# Patient Record
Sex: Female | Born: 1995 | Race: White | Hispanic: No | Marital: Married | Smoking: Never smoker
Health system: Southern US, Community
[De-identification: ages and names within clinical notes are randomized; demographics above are authoritative.]

## PROBLEM LIST (undated history)

## (undated) DIAGNOSIS — Z9889 Other specified postprocedural states: Secondary | ICD-10-CM

## (undated) DIAGNOSIS — Z87442 Personal history of urinary calculi: Secondary | ICD-10-CM

## (undated) DIAGNOSIS — E039 Hypothyroidism, unspecified: Secondary | ICD-10-CM

## (undated) DIAGNOSIS — R112 Nausea with vomiting, unspecified: Secondary | ICD-10-CM

## (undated) HISTORY — PX: CHOLECYSTECTOMY: SHX55

---

## 2016-07-30 ENCOUNTER — Other Ambulatory Visit: Payer: Self-pay | Admitting: Medical

## 2016-07-30 DIAGNOSIS — N921 Excessive and frequent menstruation with irregular cycle: Secondary | ICD-10-CM

## 2016-08-08 ENCOUNTER — Other Ambulatory Visit: Payer: Self-pay

## 2016-08-12 ENCOUNTER — Ambulatory Visit
Admission: RE | Admit: 2016-08-12 | Discharge: 2016-08-12 | Disposition: A | Discharge status: Home / Self Care | Payer: BC Managed Care – PPO | Source: Ambulatory Visit | Attending: Medical | Admitting: Medical

## 2016-08-12 DIAGNOSIS — N921 Excessive and frequent menstruation with irregular cycle: Secondary | ICD-10-CM

## 2017-01-10 ENCOUNTER — Other Ambulatory Visit: Payer: Self-pay | Admitting: Internal Medicine

## 2017-01-10 DIAGNOSIS — R1012 Left upper quadrant pain: Secondary | ICD-10-CM

## 2017-04-06 ENCOUNTER — Other Ambulatory Visit: Payer: Self-pay | Admitting: Internal Medicine

## 2017-04-06 DIAGNOSIS — R58 Hemorrhage, not elsewhere classified: Secondary | ICD-10-CM

## 2017-04-06 DIAGNOSIS — O209 Hemorrhage in early pregnancy, unspecified: Secondary | ICD-10-CM

## 2017-06-01 LAB — OB RESULTS CONSOLE RPR: RPR: NONREACTIVE

## 2017-06-01 LAB — OB RESULTS CONSOLE ABO/RH: RH TYPE: NEGATIVE

## 2017-06-01 LAB — OB RESULTS CONSOLE HIV ANTIBODY (ROUTINE TESTING): HIV: NONREACTIVE

## 2017-06-01 LAB — OB RESULTS CONSOLE RUBELLA ANTIBODY, IGM: RUBELLA: IMMUNE

## 2017-06-01 LAB — OB RESULTS CONSOLE HEPATITIS B SURFACE ANTIGEN: Hepatitis B Surface Ag: NEGATIVE

## 2017-06-01 LAB — OB RESULTS CONSOLE ANTIBODY SCREEN: Antibody Screen: NEGATIVE

## 2017-06-01 LAB — OB RESULTS CONSOLE GC/CHLAMYDIA
Chlamydia: NEGATIVE
Gonorrhea: NEGATIVE

## 2017-12-29 ENCOUNTER — Encounter (HOSPITAL_COMMUNITY): Payer: Self-pay | Admitting: *Deleted

## 2017-12-29 LAB — OB RESULTS CONSOLE GBS: GBS: NEGATIVE

## 2018-01-04 NOTE — Patient Instructions (Signed)
Robin Walker  01/04/2018   Your procedure is scheduled on:  01/06/2018  Enter through the Main Entrance of [facility] at 0530 AM.  Pick up the phone at the desk and dial [phone]  Call this number if you have problems the morning of [phone]  Remember:   Do not eat food:(After Midnight) Desps de medianoche.  Do not drink clear liquids: (After Midnight) Desps de medianoche.  Take these medicines the morning of surgery with A SIP OF WATER: synthroid   Do not wear jewelry, make-up or nail polish.  Do not wear lotions, powders, or perfumes. Do not wear deodorant.  Do not shave 48 hours prior to surgery.  Do not bring valuables to the hospital.  [facility] is not   responsible for any belongings or valuables brought to the hospital.  Contacts, dentures or bridgework may not be worn into surgery.  Leave suitcase in the car. After surgery it may be brought to your room.  For patients admitted to the hospital, checkout time is 11:00 AM the day of              discharge.    N/A   Please read over the following fact sheets that you were given:   Surgical Site Infection Prevention

## 2018-01-05 ENCOUNTER — Encounter (HOSPITAL_COMMUNITY)
Admission: RE | Admit: 2018-01-05 | Discharge: 2018-01-05 | Disposition: A | Discharge status: Home / Self Care | Payer: BC Managed Care – PPO | Source: Ambulatory Visit | Attending: Obstetrics and Gynecology | Admitting: Obstetrics and Gynecology

## 2018-01-05 HISTORY — DX: Nausea with vomiting, unspecified: R11.2

## 2018-01-05 HISTORY — DX: Other specified postprocedural states: Z98.890

## 2018-01-05 HISTORY — DX: Hypothyroidism, unspecified: E03.9

## 2018-01-05 HISTORY — DX: Personal history of urinary calculi: Z87.442

## 2018-01-05 LAB — CBC
HCT: 35.6 % — ABNORMAL LOW (ref 36.0–46.0)
HEMOGLOBIN: 11.9 g/dL — AB (ref 12.0–15.0)
MCH: 29.7 pg (ref 26.0–34.0)
MCHC: 33.4 g/dL (ref 30.0–36.0)
MCV: 88.8 fL (ref 80.0–100.0)
NRBC: 0 % (ref 0.0–0.2)
PLATELETS: 256 10*3/uL (ref 150–400)
RBC: 4.01 MIL/uL (ref 3.87–5.11)
RDW: 14.1 % (ref 11.5–15.5)
WBC: 11.1 10*3/uL — AB (ref 4.0–10.5)

## 2018-01-05 NOTE — Anesthesia Preprocedure Evaluation (Addendum)
Anesthesia Evaluation  Patient identified by MRN, date of birth, ID band Patient awake    Reviewed: Allergy & Precautions, H&P , NPO status , Patient's Chart, lab work & pertinent test results, reviewed documented beta blocker date and time   History of Anesthesia Complications (+) PONV and history of anesthetic complications  Airway Mallampati: II  TM Distance: >3 FB Neck ROM: full    Dental no notable dental hx.    Pulmonary neg pulmonary ROS,    Pulmonary exam normal breath sounds clear to auscultation       Cardiovascular Exercise Tolerance: Good negative cardio ROS   Rhythm:regular Rate:Normal     Neuro/Psych negative neurological ROS  negative psych ROS   GI/Hepatic negative GI ROS, Neg liver ROS,   Endo/Other  negative endocrine ROSHypothyroidism Morbid obesity  Renal/GU negative Renal ROS  negative genitourinary   Musculoskeletal   Abdominal   Peds  Hematology negative hematology ROS (+)   Anesthesia Other Findings   Reproductive/Obstetrics negative OB ROS                           Anesthesia Physical Anesthesia Plan  ASA: III  Anesthesia Plan: General   Post-op Pain Management:    Induction:   PONV Risk Score and Plan: 3 and Treatment may vary due to age or medical condition and Scopolamine patch - Pre-op  Airway Management Planned: Nasal Cannula and Natural Airway  Additional Equipment:   Intra-op Plan:   Post-operative Plan:   Informed Consent: I have reviewed the patients History and Physical, chart, labs and discussed the procedure including the risks, benefits and alternatives for the proposed anesthesia with the patient or authorized representative who has indicated his/her understanding and acceptance.   Dental Advisory Given  Plan Discussed with: CRNA, Anesthesiologist and Surgeon  Anesthesia Plan Comments: (  )      Anesthesia Quick  Evaluation

## 2018-01-06 ENCOUNTER — Inpatient Hospital Stay (HOSPITAL_COMMUNITY)
Admission: AD | Admit: 2018-01-06 | Discharge: 2018-01-09 | DRG: 788 | Disposition: A | Discharge status: Home / Self Care | Payer: BC Managed Care – PPO | Attending: Obstetrics and Gynecology | Admitting: Obstetrics and Gynecology

## 2018-01-06 ENCOUNTER — Encounter (HOSPITAL_COMMUNITY): Payer: Self-pay | Admitting: *Deleted

## 2018-01-06 ENCOUNTER — Inpatient Hospital Stay (HOSPITAL_COMMUNITY): Payer: BC Managed Care – PPO | Admitting: Anesthesiology

## 2018-01-06 ENCOUNTER — Encounter (HOSPITAL_COMMUNITY)
Admission: AD | Disposition: A | Discharge status: Home / Self Care | Payer: Self-pay | Source: Home / Self Care | Attending: Obstetrics and Gynecology

## 2018-01-06 ENCOUNTER — Other Ambulatory Visit: Payer: Self-pay

## 2018-01-06 DIAGNOSIS — E039 Hypothyroidism, unspecified: Secondary | ICD-10-CM | POA: Diagnosis present

## 2018-01-06 DIAGNOSIS — Z6791 Unspecified blood type, Rh negative: Secondary | ICD-10-CM | POA: Diagnosis not present

## 2018-01-06 DIAGNOSIS — Z3A39 39 weeks gestation of pregnancy: Secondary | ICD-10-CM | POA: Diagnosis not present

## 2018-01-06 DIAGNOSIS — O99284 Endocrine, nutritional and metabolic diseases complicating childbirth: Secondary | ICD-10-CM | POA: Diagnosis present

## 2018-01-06 DIAGNOSIS — O99214 Obesity complicating childbirth: Secondary | ICD-10-CM | POA: Diagnosis present

## 2018-01-06 DIAGNOSIS — O26893 Other specified pregnancy related conditions, third trimester: Secondary | ICD-10-CM | POA: Diagnosis present

## 2018-01-06 DIAGNOSIS — O3663X0 Maternal care for excessive fetal growth, third trimester, not applicable or unspecified: Secondary | ICD-10-CM | POA: Diagnosis present

## 2018-01-06 LAB — RPR: RPR Ser Ql: NONREACTIVE

## 2018-01-06 SURGERY — Surgical Case
Anesthesia: General | Site: Abdomen | Wound class: Clean Contaminated

## 2018-01-06 MED ORDER — BUPIVACAINE IN DEXTROSE 0.75-8.25 % IT SOLN
INTRATHECAL | Status: DC | PRN
  Administered 2018-01-06: 1.6 mL via INTRATHECAL

## 2018-01-06 MED ORDER — SCOPOLAMINE 1 MG/3DAYS TD PT72
MEDICATED_PATCH | TRANSDERMAL | Status: AC
  Filled 2018-01-06: qty 1

## 2018-01-06 MED ORDER — LACTATED RINGERS IV SOLN
INTRAVENOUS | Status: DC | PRN
  Administered 2018-01-06 (×2): via INTRAVENOUS

## 2018-01-06 MED ORDER — MORPHINE SULFATE (PF) 0.5 MG/ML IJ SOLN
INTRAMUSCULAR | Status: AC
  Filled 2018-01-06: qty 10

## 2018-01-06 MED ORDER — OXYTOCIN 10 UNIT/ML IJ SOLN
INTRAMUSCULAR | Status: AC
  Filled 2018-01-06: qty 4

## 2018-01-06 MED ORDER — OXYCODONE HCL 5 MG/5ML PO SOLN: 5.0000 mg | Freq: Once | ORAL | Status: DC | PRN

## 2018-01-06 MED ORDER — SIMETHICONE 80 MG PO CHEW
80.0000 mg | CHEWABLE_TABLET | ORAL | Status: DC
  Administered 2018-01-07 – 2018-01-08 (×3): 80 mg via ORAL
  Filled 2018-01-06 (×4): qty 1

## 2018-01-06 MED ORDER — MORPHINE SULFATE (PF) 0.5 MG/ML IJ SOLN
INTRAMUSCULAR | Status: DC | PRN
  Administered 2018-01-06: .15 mg via INTRATHECAL

## 2018-01-06 MED ORDER — FENTANYL CITRATE (PF) 100 MCG/2ML IJ SOLN
INTRAMUSCULAR | Status: AC
  Filled 2018-01-06: qty 2

## 2018-01-06 MED ORDER — OXYTOCIN 40 UNITS IN LACTATED RINGERS INFUSION - SIMPLE MED: 2.5000 [IU]/h | INTRAVENOUS | Status: AC

## 2018-01-06 MED ORDER — TETANUS-DIPHTH-ACELL PERTUSSIS 5-2.5-18.5 LF-MCG/0.5 IM SUSP: 0.5000 mL | Freq: Once | INTRAMUSCULAR | Status: DC

## 2018-01-06 MED ORDER — SENNOSIDES-DOCUSATE SODIUM 8.6-50 MG PO TABS
2.0000 | ORAL_TABLET | ORAL | Status: DC
  Administered 2018-01-07 – 2018-01-08 (×3): 2 via ORAL
  Filled 2018-01-06 (×5): qty 2

## 2018-01-06 MED ORDER — PHENYLEPHRINE 8 MG IN D5W 100 ML (0.08MG/ML) PREMIX OPTIME
INJECTION | INTRAVENOUS | Status: AC
  Filled 2018-01-06: qty 100

## 2018-01-06 MED ORDER — SCOPOLAMINE 1 MG/3DAYS TD PT72
1.0000 | MEDICATED_PATCH | Freq: Once | TRANSDERMAL | Status: AC
  Administered 2018-01-06: 1.5 mg via TRANSDERMAL

## 2018-01-06 MED ORDER — MEASLES, MUMPS & RUBELLA VAC IJ SOLR
0.5000 mL | Freq: Once | INTRAMUSCULAR | Status: DC
  Filled 2018-01-06: qty 0.5

## 2018-01-06 MED ORDER — NALOXONE HCL 4 MG/10ML IJ SOLN: 1.0000 ug/kg/h | INTRAVENOUS | Status: DC | PRN

## 2018-01-06 MED ORDER — DIPHENHYDRAMINE HCL 25 MG PO CAPS: 25.0000 mg | ORAL_CAPSULE | ORAL | Status: DC | PRN

## 2018-01-06 MED ORDER — DEXAMETHASONE SODIUM PHOSPHATE 4 MG/ML IJ SOLN
INTRAMUSCULAR | Status: DC | PRN
  Administered 2018-01-06: 4 mg via INTRAVENOUS

## 2018-01-06 MED ORDER — KETOROLAC TROMETHAMINE 30 MG/ML IJ SOLN
INTRAMUSCULAR | Status: AC
  Filled 2018-01-06: qty 1

## 2018-01-06 MED ORDER — LACTATED RINGERS IV SOLN
INTRAVENOUS | Status: DC | PRN
  Administered 2018-01-06: 08:00:00 via INTRAVENOUS

## 2018-01-06 MED ORDER — SIMETHICONE 80 MG PO CHEW
80.0000 mg | CHEWABLE_TABLET | Freq: Three times a day (TID) | ORAL | Status: DC
  Administered 2018-01-08 (×2): 80 mg via ORAL
  Filled 2018-01-06 (×5): qty 1

## 2018-01-06 MED ORDER — DEXAMETHASONE SODIUM PHOSPHATE 4 MG/ML IJ SOLN
INTRAMUSCULAR | Status: AC
  Filled 2018-01-06: qty 1

## 2018-01-06 MED ORDER — KETOROLAC TROMETHAMINE 30 MG/ML IJ SOLN
30.0000 mg | Freq: Four times a day (QID) | INTRAMUSCULAR | Status: AC | PRN
  Administered 2018-01-06: 30 mg via INTRAMUSCULAR

## 2018-01-06 MED ORDER — PHENYLEPHRINE 8 MG IN D5W 100 ML (0.08MG/ML) PREMIX OPTIME
INJECTION | INTRAVENOUS | Status: DC | PRN
  Administered 2018-01-06: 60 ug/min via INTRAVENOUS

## 2018-01-06 MED ORDER — SODIUM CHLORIDE 0.9 % IV SOLN
2.0000 g | INTRAVENOUS | Status: AC
  Administered 2018-01-06: 2 g via INTRAVENOUS
  Filled 2018-01-06: qty 2

## 2018-01-06 MED ORDER — KETOROLAC TROMETHAMINE 30 MG/ML IJ SOLN: 30.0000 mg | Freq: Four times a day (QID) | INTRAMUSCULAR | Status: AC | PRN

## 2018-01-06 MED ORDER — IBUPROFEN 600 MG PO TABS
600.0000 mg | ORAL_TABLET | Freq: Four times a day (QID) | ORAL | Status: DC
  Administered 2018-01-06 – 2018-01-09 (×11): 600 mg via ORAL
  Filled 2018-01-06 (×11): qty 1

## 2018-01-06 MED ORDER — SIMETHICONE 80 MG PO CHEW
80.0000 mg | CHEWABLE_TABLET | ORAL | Status: DC | PRN
  Administered 2018-01-06 – 2018-01-07 (×2): 80 mg via ORAL

## 2018-01-06 MED ORDER — NALBUPHINE HCL 10 MG/ML IJ SOLN: 5.0000 mg | INTRAMUSCULAR | Status: DC | PRN

## 2018-01-06 MED ORDER — COCONUT OIL OIL
1.0000 "application " | TOPICAL_OIL | Status: DC | PRN
  Administered 2018-01-08: 1 via TOPICAL
  Filled 2018-01-06: qty 120

## 2018-01-06 MED ORDER — FENTANYL CITRATE (PF) 100 MCG/2ML IJ SOLN
INTRAMUSCULAR | Status: DC | PRN
  Administered 2018-01-06: 15 ug via INTRATHECAL

## 2018-01-06 MED ORDER — ACETAMINOPHEN 325 MG PO TABS
650.0000 mg | ORAL_TABLET | ORAL | Status: DC | PRN
  Administered 2018-01-08 (×2): 650 mg via ORAL
  Filled 2018-01-06 (×2): qty 2

## 2018-01-06 MED ORDER — MEPERIDINE HCL 25 MG/ML IJ SOLN: 6.2500 mg | INTRAMUSCULAR | Status: DC | PRN

## 2018-01-06 MED ORDER — LACTATED RINGERS IV SOLN
INTRAVENOUS | Status: DC
  Administered 2018-01-06 – 2018-01-07 (×2): via INTRAVENOUS

## 2018-01-06 MED ORDER — NALBUPHINE HCL 10 MG/ML IJ SOLN: 5.0000 mg | Freq: Once | INTRAMUSCULAR | Status: DC | PRN

## 2018-01-06 MED ORDER — ACETAMINOPHEN 325 MG PO TABS: 325.0000 mg | ORAL_TABLET | ORAL | Status: DC | PRN

## 2018-01-06 MED ORDER — WITCH HAZEL-GLYCERIN EX PADS: 1.0000 "application " | MEDICATED_PAD | CUTANEOUS | Status: DC | PRN

## 2018-01-06 MED ORDER — LEVOTHYROXINE SODIUM 125 MCG PO TABS
125.0000 ug | ORAL_TABLET | Freq: Every day | ORAL | Status: DC
  Administered 2018-01-07 – 2018-01-09 (×3): 125 ug via ORAL
  Filled 2018-01-06 (×4): qty 1

## 2018-01-06 MED ORDER — DIBUCAINE 1 % RE OINT: 1.0000 "application " | TOPICAL_OINTMENT | RECTAL | Status: DC | PRN

## 2018-01-06 MED ORDER — OXYCODONE HCL 5 MG PO TABS: 5.0000 mg | ORAL_TABLET | Freq: Once | ORAL | Status: DC | PRN

## 2018-01-06 MED ORDER — LACTATED RINGERS IV SOLN
INTRAVENOUS | Status: DC
  Administered 2018-01-06: 08:00:00 via INTRAVENOUS

## 2018-01-06 MED ORDER — ONDANSETRON HCL 4 MG/2ML IJ SOLN
INTRAMUSCULAR | Status: AC
  Filled 2018-01-06: qty 2

## 2018-01-06 MED ORDER — ONDANSETRON HCL 4 MG/2ML IJ SOLN: 4.0000 mg | Freq: Once | INTRAMUSCULAR | Status: DC | PRN

## 2018-01-06 MED ORDER — OXYTOCIN 10 UNIT/ML IJ SOLN
INTRAVENOUS | Status: DC | PRN
  Administered 2018-01-06: 40 [IU] via INTRAVENOUS

## 2018-01-06 MED ORDER — NALOXONE HCL 0.4 MG/ML IJ SOLN: 0.4000 mg | INTRAMUSCULAR | Status: DC | PRN

## 2018-01-06 MED ORDER — DIPHENHYDRAMINE HCL 50 MG/ML IJ SOLN: 12.5000 mg | INTRAMUSCULAR | Status: DC | PRN

## 2018-01-06 MED ORDER — ZOLPIDEM TARTRATE 5 MG PO TABS: 5.0000 mg | ORAL_TABLET | Freq: Every evening | ORAL | Status: DC | PRN

## 2018-01-06 MED ORDER — ONDANSETRON HCL 4 MG/2ML IJ SOLN: 4.0000 mg | Freq: Three times a day (TID) | INTRAMUSCULAR | Status: DC | PRN

## 2018-01-06 MED ORDER — ACETAMINOPHEN 160 MG/5ML PO SOLN: 325.0000 mg | ORAL | Status: DC | PRN

## 2018-01-06 MED ORDER — SODIUM CHLORIDE 0.9% FLUSH: 3.0000 mL | INTRAVENOUS | Status: DC | PRN

## 2018-01-06 MED ORDER — OXYCODONE HCL 5 MG PO TABS: 10.0000 mg | ORAL_TABLET | ORAL | Status: DC | PRN

## 2018-01-06 MED ORDER — FENTANYL CITRATE (PF) 100 MCG/2ML IJ SOLN: 25.0000 ug | INTRAMUSCULAR | Status: DC | PRN

## 2018-01-06 MED ORDER — ONDANSETRON HCL 4 MG/2ML IJ SOLN
INTRAMUSCULAR | Status: DC | PRN
  Administered 2018-01-06: 4 mg via INTRAVENOUS

## 2018-01-06 MED ORDER — MENTHOL 3 MG MT LOZG: 1.0000 | LOZENGE | OROMUCOSAL | Status: DC | PRN

## 2018-01-06 MED ORDER — OXYCODONE HCL 5 MG PO TABS: 5.0000 mg | ORAL_TABLET | ORAL | Status: DC | PRN

## 2018-01-06 MED ORDER — SODIUM CHLORIDE 0.9 % IR SOLN
Status: DC | PRN
  Administered 2018-01-06: 1

## 2018-01-06 MED ORDER — PRENATAL MULTIVITAMIN CH
1.0000 | ORAL_TABLET | Freq: Every day | ORAL | Status: DC
  Administered 2018-01-07 – 2018-01-08 (×2): 1 via ORAL
  Filled 2018-01-06: qty 1

## 2018-01-06 MED ORDER — DIPHENHYDRAMINE HCL 25 MG PO CAPS: 25.0000 mg | ORAL_CAPSULE | Freq: Four times a day (QID) | ORAL | Status: DC | PRN

## 2018-01-06 SURGICAL SUPPLY — 34 items
CHLORAPREP W/TINT 26ML (MISCELLANEOUS) ×2 IMPLANT
CLAMP CORD UMBIL (MISCELLANEOUS) IMPLANT
CLOTH BEACON ORANGE TIMEOUT ST (SAFETY) ×2 IMPLANT
DRSG OPSITE POSTOP 4X10 (GAUZE/BANDAGES/DRESSINGS) ×2 IMPLANT
ELECT REM PT RETURN 9FT ADLT (ELECTROSURGICAL) ×2
ELECTRODE REM PT RTRN 9FT ADLT (ELECTROSURGICAL) ×1 IMPLANT
EXTRACTOR VACUUM M CUP 4 TUBE (SUCTIONS) IMPLANT
GLOVE BIOGEL PI IND STRL 7.0 (GLOVE) ×1 IMPLANT
GLOVE BIOGEL PI INDICATOR 7.0 (GLOVE) ×1
GLOVE SURG ORTHO 8.0 STRL STRW (GLOVE) ×2 IMPLANT
GOWN STRL REUS W/TWL LRG LVL3 (GOWN DISPOSABLE) ×4 IMPLANT
HOVERMATT SINGLE USE (MISCELLANEOUS) ×2 IMPLANT
KIT ABG SYR 3ML LUER SLIP (SYRINGE) ×2 IMPLANT
NEEDLE HYPO 25X5/8 SAFETYGLIDE (NEEDLE) ×2 IMPLANT
NS IRRIG 1000ML POUR BTL (IV SOLUTION) ×2 IMPLANT
PACK C SECTION WH (CUSTOM PROCEDURE TRAY) ×2 IMPLANT
PAD OB MATERNITY 4.3X12.25 (PERSONAL CARE ITEMS) ×2 IMPLANT
PENCIL SMOKE EVAC W/HOLSTER (ELECTROSURGICAL) ×2 IMPLANT
RETRACTOR TRAXI PANNICULUS (MISCELLANEOUS) ×1 IMPLANT
RETRACTOR WND ALEXIS 25 LRG (MISCELLANEOUS) ×1 IMPLANT
RTRCTR WOUND ALEXIS 25CM LRG (MISCELLANEOUS) ×2
SPONGE LAP 18X18 RF (DISPOSABLE) ×6 IMPLANT
SPONGE LAP 18X18 X RAY DECT (DISPOSABLE) ×2 IMPLANT
SUT MNCRL 0 VIOLET CTX 36 (SUTURE) ×4 IMPLANT
SUT MON AB 4-0 PS1 27 (SUTURE) ×2 IMPLANT
SUT MONOCRYL 0 CTX 36 (SUTURE) ×4
SUT PDS AB 1 CT  36 (SUTURE)
SUT PDS AB 1 CT 36 (SUTURE) IMPLANT
SUT PLAIN 2 0 XLH (SUTURE) ×2 IMPLANT
SUT VIC AB 1 CTX 36 (SUTURE)
SUT VIC AB 1 CTX36XBRD ANBCTRL (SUTURE) IMPLANT
TOWEL OR 17X24 6PK STRL BLUE (TOWEL DISPOSABLE) ×2 IMPLANT
TRAXI PANNICULUS RETRACTOR (MISCELLANEOUS) ×1
TRAY FOLEY W/BAG SLVR 14FR LF (SET/KITS/TRAYS/PACK) ×2 IMPLANT

## 2018-01-06 NOTE — Addendum Note (Signed)
Addendum  created 01/06/18 1307 by Cleda ClarksBrowder, Zoe Creasman R, CRNA   Sign clinical note

## 2018-01-06 NOTE — Anesthesia Postprocedure Evaluation (Signed)
Anesthesia Post Note  Patient: Robin Walker  Procedure(s) Performed: CESAREAN SECTION (N/A Abdomen)     Patient location during evaluation: PACU Anesthesia Type: General Level of consciousness: awake and alert Pain management: pain level controlled Vital Signs Assessment: post-procedure vital signs reviewed and stable Respiratory status: spontaneous breathing, nonlabored ventilation, respiratory function stable and patient connected to nasal cannula oxygen Cardiovascular status: blood pressure returned to baseline and stable Postop Assessment: no apparent nausea or vomiting Anesthetic complications: no    Last Vitals:  Vitals:   01/06/18 1000 01/06/18 1015  BP: 136/67 130/65  Pulse: 63 66  Resp: 20 18  Temp:  37 C  SpO2: 99% 99%    Last Pain:  Vitals:   01/06/18 1015  TempSrc: Oral   Pain Goal:                 Edit Ricciardelli

## 2018-01-06 NOTE — Anesthesia Postprocedure Evaluation (Signed)
Anesthesia Post Note  Patient: Set designerAlexandria Walker  Procedure(s) Performed: CESAREAN SECTION (N/A Abdomen)     Patient location during evaluation: Mother Baby Anesthesia Type: Spinal Level of consciousness: awake, awake and alert and oriented Pain management: pain level controlled Vital Signs Assessment: post-procedure vital signs reviewed and stable Respiratory status: spontaneous breathing Cardiovascular status: blood pressure returned to baseline Postop Assessment: no headache, patient able to bend at knees, adequate PO intake, no backache, spinal receding, no apparent nausea or vomiting and able to ambulate Anesthetic complications: no    Last Vitals:  Vitals:   01/06/18 1119 01/06/18 1217  BP: 128/76 99/86  Pulse: 67 81  Resp: 17 17  Temp: 37.2 C   SpO2: 97% 98%    Last Pain:  Vitals:   01/06/18 1119  TempSrc: Oral   Pain Goal:                 Cleda ClarksBrowder, Kayron Hicklin R

## 2018-01-06 NOTE — Op Note (Signed)
Cesarean Section Procedure Note  Pre-operative Diagnosis: IUP at 39 weeks, Macrosomia, borderline pelvis, Pt request C/S  Post-operative Diagnosis: same  Surgeon: Turner DanielsLOWE,Tannya Gonet C   Assistants: none  Anesthesia: Spinal  Procedure:  Low Segment Transverse cesarean section  Procedure Details  The patient was seen in the Holding Room. The risks, benefits, complications, treatment options, and expected outcomes were discussed with the patient.  The patient concurred with the proposed plan, giving informed consent.  The site of surgery properly noted/marked.. A Time Out was held and the above information confirmed.  After induction of anesthesia, the patient was draped and prepped in the usual sterile manner. A Pfannenstiel incision was made and carried down through the subcutaneous tissue to the fascia. Fascial incision was made and extended transversely. The fascia was separated from the underlying rectus tissue superiorly and inferiorly. The peritoneum was identified and entered. Peritoneal incision was extended longitudinally. The utero-vesical peritoneal reflection was incised transversely and the bladder flap was bluntly freed from the lower uterine segment. A low transverse uterine incision was made. Delivered from vertex presentation was a baby with Apgar scores of 9 at one minute and 9 at five minutes. After the umbilical cord was clamped and cut cord blood was obtained for evaluation. The placenta was removed intact and appeared normal. The uterine outline, tubes and ovaries appeared normal. The uterine incision was closed with running locked sutures of 0 monocryl and imbricated with 0 monocryl. Hemostasis was observed. Lavage was carried out until clear. The peritoneum was then closed with 0 monocryl and rectus muscles plicated in the midline.  After hemostasis was assured, the fascia was then reapproximated with running sutures of 0 PDS. Irrigation was applied and after adequate hemostasis was  assured, the skin was reapproximated with subcutaneous sutures using 4-0 monocryl.  Instrument, sponge, and needle counts were correct prior the abdominal closure and at the conclusion of the case. The patient received 2 grams cefotetan preoperatively.  Findings: Viable female  Estimated Blood Loss:  430cc         Specimens: Placenta was sent to labor and delivery         Complications:  None

## 2018-01-06 NOTE — Transfer of Care (Signed)
Immediate Anesthesia Transfer of Care Note  Patient: Robin Walker  Procedure(s) Performed: CESAREAN SECTION (N/A Abdomen)  Patient Location: PACU  Anesthesia Type:Spinal  Level of Consciousness: awake and alert   Airway & Oxygen Therapy: Patient Spontanous Breathing  Post-op Assessment: Report given to RN and Post -op Vital signs reviewed and stable  Post vital signs: Reviewed  Last Vitals:  Vitals Value Taken Time  BP 111/54 01/06/2018  8:55 AM  Temp    Pulse 80 01/06/2018  8:57 AM  Resp 10 01/06/2018  8:57 AM  SpO2 98 % 01/06/2018  8:57 AM  Vitals shown include unvalidated device data.  Last Pain:  Vitals:   01/06/18 0611  TempSrc: Oral         Complications: No apparent anesthesia complications

## 2018-01-06 NOTE — H&P (Signed)
Robin Walker is a 22 y.o. female presenting for primary c/s.  Pregnancy has been complicated by Macrosomia and small pelvis and patient desires primary c/s.  US 1 week ago had EFW 4100gm and fetal vertex out of the pelvis.  Pt desires c/s.  Hypothyroidism with good control and normal glucose screening.  GBS -.  RH neg. OB History    Gravida  2   Para      Term      Preterm      AB  1   Living        SAB  1   TAB      Ectopic      Multiple      Live Births             Past Medical History:  Diagnosis Date  . History of kidney stones   . Hypothyroidism   . PONV (postoperative nausea and vomiting)    Past Surgical History:  Procedure Laterality Date  . CHOLECYSTECTOMY     Family History: family history includes Diabetes in her father and mother; Heart disease in her father; Hypertension in her father. Social History:  reports that she has never smoked. She has never used smokeless tobacco. She reports that she drank alcohol. She reports that she has current or past drug history.     Maternal Diabetes: No Genetic Screening: Normal Maternal Ultrasounds/Referrals: Normal Fetal Ultrasounds or other Referrals:  None Maternal Substance Abuse:  No Significant Maternal Medications:  Meds include: Other: synthroid Significant Maternal Lab Results:  None Other Comments:  None  ROS History   Blood pressure (!) 129/56, pulse 94, temperature 97.8 F (36.6 C), temperature source Oral, resp. rate 18, height 5\' 3"  (1.6 m), weight 118.4 kg, last menstrual period 04/05/2017. Exam Physical Exam  Cl thick and high Prenatal labs: ABO, Rh: --/--/O NEG (12/03 1012) Antibody: POS (12/03 1012) Rubella: Immune (04/29 0000) RPR: Non Reactive (12/03 1012)  HBsAg: Negative (04/29 0000)  HIV: Non-reactive (04/29 0000)  GBS: Negative (11/26 0000)   Assessment/Plan: IUP at 39 weeks Fetal Macrosomia, unfavorable cx and borderline pelvis.  Patient requests primary  c/s. Risks and benefits of C/S were discussed.  All questions were answered and informed consent was obtained.  Plan to proceed with low segment transverse Cesarean Section.This patient has been seen and examined.   All of her questions were answered.  Labs and vital signs reviewed.  Informed consent has been obtained.  The History and Physical is current.   Turner Danielsavid C Sarye Kath 01/06/2018, 7:32 AM

## 2018-01-06 NOTE — Lactation Note (Addendum)
This note was copied from a baby's chart. Lactation Consultation Note  Patient Name: Robin Walker Today's Date: 01/06/2018 Reason for consult: Initial assessment;Term;Primapara;1st time breastfeeding  P1 mother whose infant is now 4010 hours old.    Mother had no questions/concerns related to breast feeding at this time.  She stated that baby has been latching well at times and, at other times, has remained sleepy.  I reassured her this is typical behavior for a newborn at this age.  Mother's breasts are soft and non tender and nipples are intact.  Mother had no pain with latching.  Encouraged to feed 8-12 times/24 hours or sooner if baby shows feeding cues.  Reviewed feeding cues with family.  Mother has been taught hand expression and has been able to obtain a few drops of colostrum which she spoon fed back to baby.  Suggested mother continue hand expression before/after feedings to help increase milk supply.  Mom made aware of O/P services, breastfeeding support groups, community resources, and our phone # for post-discharge questions.   Mother will return to work after her 12 week leave and has a DEBP for home use.  Father and family member present.  Mother will call for assistance as needed.   Maternal Data Formula Feeding for Exclusion: No Has patient been taught Hand Expression?: Yes Does the patient have breastfeeding experience prior to this delivery?: No  Feeding Feeding Type: Breast Milk  LATCH Score                   Interventions    Lactation Tools Discussed/Used WIC Program: No   Consult Status Consult Status: Follow-up Date: 01/07/18 Follow-up type: In-patient    Deangleo Passage R Fatiha Guzy 01/06/2018, 6:20 PM

## 2018-01-06 NOTE — Anesthesia Procedure Notes (Signed)
Spinal  Patient location during procedure: OR Start time: 01/06/2018 7:38 AM End time: 01/06/2018 7:40 AM Staffing Anesthesiologist: Bethena Midgetddono, Jawad Wiacek, MD Preanesthetic Checklist Completed: patient identified, site marked, surgical consent, pre-op evaluation, timeout performed, IV checked, risks and benefits discussed and monitors and equipment checked Spinal Block Patient position: sitting Prep: DuraPrep Patient monitoring: heart rate, cardiac monitor, continuous pulse ox and blood pressure Approach: midline Location: L4-5 Injection technique: single-shot Needle Needle type: Sprotte  Needle gauge: 24 G Needle length: 9 cm Assessment Sensory level: T4

## 2018-01-07 LAB — CBC
HCT: 30.9 % — ABNORMAL LOW (ref 36.0–46.0)
HEMOGLOBIN: 10.2 g/dL — AB (ref 12.0–15.0)
MCH: 29.3 pg (ref 26.0–34.0)
MCHC: 33 g/dL (ref 30.0–36.0)
MCV: 88.8 fL (ref 80.0–100.0)
Platelets: 230 10*3/uL (ref 150–400)
RBC: 3.48 MIL/uL — ABNORMAL LOW (ref 3.87–5.11)
RDW: 14.1 % (ref 11.5–15.5)
WBC: 10.2 10*3/uL (ref 4.0–10.5)
nRBC: 0 % (ref 0.0–0.2)

## 2018-01-07 MED ORDER — RHO D IMMUNE GLOBULIN 1500 UNIT/2ML IJ SOSY
300.0000 ug | PREFILLED_SYRINGE | Freq: Once | INTRAMUSCULAR | Status: AC
  Administered 2018-01-07: 300 ug via INTRAMUSCULAR
  Filled 2018-01-07: qty 2

## 2018-01-07 NOTE — Progress Notes (Signed)
Subjective: Postpartum Day 1: Cesarean Delivery Patient reports tolerating PO.    Objective: Vital signs in last 24 hours: Temp:  [97.8 F (36.6 C)-98.9 F (37.2 C)] 97.8 F (36.6 C) (12/05 0503) Pulse Rate:  [63-85] 69 (12/05 0503) Resp:  [16-25] 16 (12/05 0503) BP: (99-136)/(44-86) 117/53 (12/05 0503) SpO2:  [93 %-100 %] 97 % (12/05 0007)  Physical Exam:  General: alert Lochia: appropriate Uterine Fundus: firm Incision: healing well DVT Evaluation: No evidence of DVT seen on physical exam.  Recent Labs    01/05/18 1012 01/07/18 0503  HGB 11.9* 10.2*  HCT 35.6* 30.9*    Assessment/Plan: Status post Cesarean section. Doing well postoperatively.  Continue current care.  Robin Walker Milana ObeyM Haniyyah Sakuma 01/07/2018, 8:38 AM

## 2018-01-07 NOTE — Lactation Note (Signed)
This note was copied from a baby's chart. Lactation Consultation Note  Patient Name: Robin Walker Today's Date: 01/07/2018 Reason for consult: Follow-up assessment;Hyperbilirubinemia Baby is receiving double phototherapy.  Mom is breastfeeding with cues and reports baby is doing well.  RN assisted with a feeding this morning and baby was tongue thrusting.  Education given on what a good feeding would be.  DEBP has been initiated.  Mom states she just gave baby 2 mls but she was sleepy and didn't want anymore.  I asked if I could try to syringe feed baby the 8 mls remaining and mom agreeable.  Baby sucked well on gloved finger and took 8 mls of colostrum.  Stressed importance of feeding with cues but at least every 3 hours. Instructed to pump every 3 hours. Discussed using a slow flow nipple if mom is having difficulty with syringe feeding.  Encouraged to call out for assist/concerns prn.  Report given to RN.  Maternal Data    Feeding Feeding Type: Breast Milk  LATCH Score                   Interventions    Lactation Tools Discussed/Used WIC Program: No Pump Review: Setup, frequency, and cleaning;Milk Storage Initiated by:: RN Date initiated:: 01/07/18   Consult Status Consult Status: Follow-up Date: 01/08/18 Follow-up type: In-patient    Huston FoleyMOULDEN, Miron Marxen S 01/07/2018, 1:24 PM

## 2018-01-08 ENCOUNTER — Encounter (HOSPITAL_COMMUNITY): Payer: Self-pay | Admitting: *Deleted

## 2018-01-08 LAB — RH IG WORKUP (INCLUDES ABO/RH)
ABO/RH(D): O NEG
Fetal Screen: NEGATIVE
Gestational Age(Wks): 39
Unit division: 0

## 2018-01-08 NOTE — Progress Notes (Signed)
Subjective: Postpartum Day 2: Cesarean Delivery s/s elective, macrosomia Patient reports tolerating PO, + flatus and no problems voiding.    Objective: Vital signs in last 24 hours: Temp:  [97.8 F (36.6 C)-98.2 F (36.8 C)] 98 F (36.7 C) (12/06 0523) Pulse Rate:  [71-88] 77 (12/06 0523) Resp:  [18] 18 (12/05 1417) BP: (115-118)/(60-68) 118/68 (12/06 0523) SpO2:  [97 %-99 %] 99 % (12/05 2324)  Physical Exam:  General: alert, cooperative and appears stated age 21Lochia: appropriate Uterine Fundus: firm Incision: healing well, no significant drainage, no dehiscence, no significant erythema DVT Evaluation: No evidence of DVT seen on physical exam. Negative Homan's sign. No cords or calf tenderness. No significant calf/ankle edema.  Recent Labs    01/05/18 1012 01/07/18 0503  HGB 11.9* 10.2*  HCT 35.6* 30.9*    Assessment/Plan: Status post Cesarean section. Doing well postoperatively.  Continue current care. Baby under bili lights - weighed 10#.   Robin Walker Robin Walker 01/08/2018, 9:25 AM

## 2018-01-08 NOTE — Lactation Note (Signed)
This note was copied from a baby's chart. Lactation Consultation Note  Patient Name: Robin Walker Today's Date: 01/08/2018 Reason for consult: Follow-up assessment Phototherapy discontinued.  Mom states baby latched this morning.  Instructed to call for latch assist prn.  She continues to pump and give expressed milk and formula to baby with slow flow nipple.   Maternal Data    Feeding Feeding Type: Bottle Fed - Formula Nipple Type: Slow - flow  LATCH Score Latch: Repeated attempts needed to sustain latch, nipple held in mouth throughout feeding, stimulation needed to elicit sucking reflex.  Audible Swallowing: A few with stimulation  Type of Nipple: Everted at rest and after stimulation  Comfort (Breast/Nipple): Filling, red/small blisters or bruises, mild/mod discomfort  Hold (Positioning): No assistance needed to correctly position infant at breast.  LATCH Score: 7  Interventions    Lactation Tools Discussed/Used     Consult Status Consult Status: Follow-up Date: 01/09/18 Follow-up type: In-patient    Huston FoleyMOULDEN, Mellina Benison S 01/08/2018, 10:24 AM

## 2018-01-09 LAB — BPAM RBC
Blood Product Expiration Date: 201912282359
Blood Product Expiration Date: 201912282359
UNIT TYPE AND RH: 9500
Unit Type and Rh: 9500

## 2018-01-09 LAB — TYPE AND SCREEN
ABO/RH(D): O NEG
ANTIBODY SCREEN: POSITIVE
UNIT DIVISION: 0
Unit division: 0

## 2018-01-09 MED ORDER — IBUPROFEN 600 MG PO TABS: 600.0000 mg | ORAL_TABLET | Freq: Four times a day (QID) | ORAL | 0 refills | Status: DC | PRN

## 2018-01-09 MED ORDER — DOCUSATE SODIUM 100 MG PO CAPS: 100.0000 mg | ORAL_CAPSULE | Freq: Two times a day (BID) | ORAL | 2 refills | Status: DC

## 2018-01-09 MED ORDER — OXYCODONE HCL 5 MG PO TABS: 5.0000 mg | ORAL_TABLET | ORAL | 0 refills | Status: DC | PRN

## 2018-01-09 NOTE — Lactation Note (Signed)
This note was copied from a baby's chart. Lactation Consultation Note  Patient Name: [name] Today's Date: 01/09/2018 Reason for consult: Follow-up assessment;Term;1st time breastfeeding;Primapara  P1 mother whose infant is now 7775 hours old.    Mother had no questions/concerns related to breast feeding.  Her breasts are soft and non tender and her nipples are short shafted but intact.  Mother has a bruised left areola and has not put baby to that breast with every feeding.  She believes this happened with a poor latch after delivery.  Mother is using EBM, coconut oil and comfort gels for relief.  Baby was fussy and I offered to assist with latching.  Mother agreed and attempted to latch to the left breast.  Baby was able to latch but became very irritable at the breast and showed no interest in sucking at this time.  Father had recently fed some EBM.  Mother held baby STS and she quieted.    Encouraged to continue feeding 8-12 times/24 hours or sooner if baby shows cues.  Mother has been pumping an average of 15 mls from each breast after feedings and supplementing baby.  Offered to demonstrated an alternative way of supplementing but mother prefers the artificial nipple.  Engorgement prevention/treatment discussed.  Mother has a manual pump and a DEBP for home use.  She has our OP phone number for questions/concerns after discharge.    Father present and supportive.   Maternal Data Formula Feeding for Exclusion: No Has patient been taught Hand Expression?: Yes Does the patient have breastfeeding experience prior to this delivery?: No  Feeding    LATCH Score                   Interventions    Lactation Tools Discussed/Used Tools: Pump;Coconut oil;Comfort gels Breast pump type: Double-Electric Breast Pump;Manual WIC Program: No   Consult Status Consult Status: Complete Date: 01/09/18 Follow-up type: Call as needed    Brandon Scarbrough R Lachrista Heslin 01/09/2018,  11:12 AM

## 2018-01-09 NOTE — Discharge Summary (Signed)
Obstetric Discharge Summary Reason for Admission: cesarean section and elective, macrosomia Prenatal Procedures: none Intrapartum Procedures: cesarean: low cervical, transverse Postpartum Procedures: none Complications-Operative and Postpartum: none Hemoglobin  Date Value Ref Range Status  01/07/2018 10.2 (L) 12.0 - 15.0 g/dL Final   HCT  Date Value Ref Range Status  01/07/2018 30.9 (L) 36.0 - 46.0 % Final    Physical Exam:  General: alert, cooperative and appears stated age 1Lochia: appropriate Uterine Fundus: firm Incision: healing well, no significant drainage, no dehiscence, no significant erythema DVT Evaluation: No evidence of DVT seen on physical exam. Negative Homan's sign. No cords or calf tenderness. No significant calf/ankle edema.  Discharge Diagnoses: Term Pregnancy-delivered  Discharge Information: Date: 01/09/2018 Activity: pelvic rest Diet: routine Medications: Ibuprofen, Colace and Percocet Condition: stable Instructions: refer to practice specific booklet Discharge to: home   Newborn Data: Live born female  Birth Weight: 10 lb 0.3 oz (4545 g) APGAR: 8, 9  Newborn Delivery   Birth date/time:  01/[birth year]/2018 08:06:00 Delivery type:  C-Section, Low Transverse Trial of labor:  No C-section categorization:  Primary     Home with mother.  Robin Walker 01/09/2018, 8:16 AM

## 2019-05-05 ENCOUNTER — Ambulatory Visit: Payer: BC Managed Care – PPO | Admitting: Neurology

## 2020-02-02 LAB — OB RESULTS CONSOLE HIV ANTIBODY (ROUTINE TESTING): HIV: NONREACTIVE

## 2020-02-02 LAB — OB RESULTS CONSOLE RUBELLA ANTIBODY, IGM: Rubella: IMMUNE

## 2020-02-02 LAB — HEPATITIS C ANTIBODY: HCV Ab: NEGATIVE

## 2020-02-02 LAB — OB RESULTS CONSOLE HEPATITIS B SURFACE ANTIGEN: Hepatitis B Surface Ag: NEGATIVE

## 2020-06-22 ENCOUNTER — Inpatient Hospital Stay (HOSPITAL_COMMUNITY)
Admission: AD | Admit: 2020-06-22 | Discharge: 2020-06-24 | DRG: 832 | Disposition: A | Discharge status: Home / Self Care | Payer: BC Managed Care – PPO | Attending: Obstetrics & Gynecology | Admitting: Obstetrics & Gynecology

## 2020-06-22 ENCOUNTER — Other Ambulatory Visit: Payer: Self-pay

## 2020-06-22 ENCOUNTER — Inpatient Hospital Stay (HOSPITAL_COMMUNITY): Payer: BC Managed Care – PPO

## 2020-06-22 ENCOUNTER — Observation Stay (HOSPITAL_COMMUNITY): Payer: BC Managed Care – PPO

## 2020-06-22 ENCOUNTER — Encounter (HOSPITAL_COMMUNITY): Payer: Self-pay | Admitting: Obstetrics & Gynecology

## 2020-06-22 DIAGNOSIS — O99891 Other specified diseases and conditions complicating pregnancy: Secondary | ICD-10-CM | POA: Diagnosis not present

## 2020-06-22 DIAGNOSIS — R109 Unspecified abdominal pain: Secondary | ICD-10-CM

## 2020-06-22 DIAGNOSIS — N200 Calculus of kidney: Secondary | ICD-10-CM

## 2020-06-22 DIAGNOSIS — N132 Hydronephrosis with renal and ureteral calculous obstruction: Secondary | ICD-10-CM | POA: Diagnosis present

## 2020-06-22 DIAGNOSIS — O26893 Other specified pregnancy related conditions, third trimester: Secondary | ICD-10-CM | POA: Diagnosis not present

## 2020-06-22 DIAGNOSIS — Z3A28 28 weeks gestation of pregnancy: Secondary | ICD-10-CM

## 2020-06-22 DIAGNOSIS — Z6791 Unspecified blood type, Rh negative: Secondary | ICD-10-CM

## 2020-06-22 DIAGNOSIS — E039 Hypothyroidism, unspecified: Secondary | ICD-10-CM | POA: Diagnosis present

## 2020-06-22 DIAGNOSIS — O99283 Endocrine, nutritional and metabolic diseases complicating pregnancy, third trimester: Secondary | ICD-10-CM | POA: Diagnosis present

## 2020-06-22 DIAGNOSIS — O26833 Pregnancy related renal disease, third trimester: Secondary | ICD-10-CM | POA: Diagnosis not present

## 2020-06-22 DIAGNOSIS — R319 Hematuria, unspecified: Secondary | ICD-10-CM

## 2020-06-22 DIAGNOSIS — O34219 Maternal care for unspecified type scar from previous cesarean delivery: Secondary | ICD-10-CM | POA: Diagnosis present

## 2020-06-22 DIAGNOSIS — Z20822 Contact with and (suspected) exposure to covid-19: Secondary | ICD-10-CM | POA: Diagnosis present

## 2020-06-22 DIAGNOSIS — Z87442 Personal history of urinary calculi: Secondary | ICD-10-CM

## 2020-06-22 LAB — URINALYSIS, ROUTINE W REFLEX MICROSCOPIC
Bilirubin Urine: NEGATIVE
Glucose, UA: NEGATIVE mg/dL
Ketones, ur: 20 mg/dL — AB
Nitrite: NEGATIVE
Protein, ur: NEGATIVE mg/dL
Specific Gravity, Urine: 1.005 (ref 1.005–1.030)
pH: 6 (ref 5.0–8.0)

## 2020-06-22 LAB — COMPREHENSIVE METABOLIC PANEL
ALT: 11 U/L (ref 0–44)
AST: 12 U/L — ABNORMAL LOW (ref 15–41)
Albumin: 3 g/dL — ABNORMAL LOW (ref 3.5–5.0)
Alkaline Phosphatase: 62 U/L (ref 38–126)
Anion gap: 10 (ref 5–15)
BUN: 5 mg/dL — ABNORMAL LOW (ref 6–20)
CO2: 22 mmol/L (ref 22–32)
Calcium: 8.6 mg/dL — ABNORMAL LOW (ref 8.9–10.3)
Chloride: 104 mmol/L (ref 98–111)
Creatinine, Ser: 1.03 mg/dL — ABNORMAL HIGH (ref 0.44–1.00)
GFR, Estimated: >60 mL/min (ref >60)
Glucose, Bld: 90 mg/dL (ref 70–99)
Potassium: 3.7 mmol/L (ref 3.5–5.1)
Sodium: 136 mmol/L (ref 135–145)
Total Bilirubin: 0.3 mg/dL (ref 0.3–1.2)
Total Protein: 6.4 g/dL — ABNORMAL LOW (ref 6.5–8.1)

## 2020-06-22 LAB — CBC WITH DIFFERENTIAL/PLATELET
Abs Immature Granulocytes: 0.12 10*3/uL — ABNORMAL HIGH (ref 0.00–0.07)
Basophils Absolute: 0 10*3/uL (ref 0.0–0.1)
Basophils Relative: 0 %
Eosinophils Absolute: 0.1 10*3/uL (ref 0.0–0.5)
Eosinophils Relative: 1 %
HCT: 33 % — ABNORMAL LOW (ref 36.0–46.0)
Hemoglobin: 10.9 g/dL — ABNORMAL LOW (ref 12.0–15.0)
Immature Granulocytes: 1 %
Lymphocytes Relative: 8 %
Lymphs Abs: 1.2 10*3/uL (ref 0.7–4.0)
MCH: 29.8 pg (ref 26.0–34.0)
MCHC: 33 g/dL (ref 30.0–36.0)
MCV: 90.2 fL (ref 80.0–100.0)
Monocytes Absolute: 0.7 10*3/uL (ref 0.1–1.0)
Monocytes Relative: 5 %
Neutro Abs: 12.6 10*3/uL — ABNORMAL HIGH (ref 1.7–7.7)
Neutrophils Relative %: 85 %
Platelets: 261 10*3/uL (ref 150–400)
RBC: 3.66 MIL/uL — ABNORMAL LOW (ref 3.87–5.11)
RDW: 13.6 % (ref 11.5–15.5)
WBC: 14.8 10*3/uL — ABNORMAL HIGH (ref 4.0–10.5)
nRBC: 0 % (ref 0.0–0.2)

## 2020-06-22 LAB — TYPE AND SCREEN
ABO/RH(D): O NEG
Antibody Screen: POSITIVE

## 2020-06-22 LAB — RESP PANEL BY RT-PCR (FLU A&B, COVID) ARPGX2
Influenza A by PCR: NEGATIVE
Influenza B by PCR: NEGATIVE
SARS Coronavirus 2 by RT PCR: NEGATIVE

## 2020-06-22 MED ORDER — DOCUSATE SODIUM 100 MG PO CAPS
100.0000 mg | ORAL_CAPSULE | Freq: Every day | ORAL | Status: DC
  Administered 2020-06-24: 100 mg via ORAL
  Filled 2020-06-22: qty 1

## 2020-06-22 MED ORDER — SODIUM CHLORIDE 0.9 % IV SOLN
2.0000 g | INTRAVENOUS | Status: DC
  Administered 2020-06-22 – 2020-06-23 (×2): 2 g via INTRAVENOUS
  Filled 2020-06-22: qty 2
  Filled 2020-06-22: qty 20

## 2020-06-22 MED ORDER — LACTATED RINGERS IV SOLN: INTRAVENOUS | Status: DC

## 2020-06-22 MED ORDER — HYDROMORPHONE HCL 1 MG/ML IJ SOLN
1.0000 mg | Freq: Once | INTRAMUSCULAR | Status: AC
Start: 2020-06-22 — End: 2020-06-22
  Administered 2020-06-22: 1 mg via INTRAVENOUS

## 2020-06-22 MED ORDER — LACTATED RINGERS IV BOLUS
1000.0000 mL | Freq: Once | INTRAVENOUS | Status: AC
  Administered 2020-06-22: 1000 mL via INTRAVENOUS

## 2020-06-22 MED ORDER — CALCIUM CARBONATE ANTACID 500 MG PO CHEW: 2.0000 | CHEWABLE_TABLET | ORAL | Status: DC | PRN

## 2020-06-22 MED ORDER — ZOLPIDEM TARTRATE 5 MG PO TABS: 5.0000 mg | ORAL_TABLET | Freq: Every evening | ORAL | Status: DC | PRN

## 2020-06-22 MED ORDER — PRENATAL MULTIVITAMIN CH
1.0000 | ORAL_TABLET | Freq: Every day | ORAL | Status: DC
  Administered 2020-06-24: 1 via ORAL
  Filled 2020-06-22: qty 1

## 2020-06-22 MED ORDER — LEVOTHYROXINE SODIUM 25 MCG PO TABS
125.0000 ug | ORAL_TABLET | Freq: Every day | ORAL | Status: DC
  Administered 2020-06-23 – 2020-06-24 (×2): 125 ug via ORAL
  Filled 2020-06-22 (×2): qty 5

## 2020-06-22 MED ORDER — ACETAMINOPHEN 325 MG PO TABS
650.0000 mg | ORAL_TABLET | ORAL | Status: DC | PRN
  Administered 2020-06-24: 650 mg via ORAL
  Filled 2020-06-22: qty 2

## 2020-06-22 MED ORDER — SODIUM CHLORIDE 0.9 % IV SOLN
8.0000 mg | Freq: Three times a day (TID) | INTRAVENOUS | Status: DC | PRN
  Administered 2020-06-23: 8 mg via INTRAVENOUS
  Filled 2020-06-22: qty 4

## 2020-06-22 MED ORDER — HYDROMORPHONE HCL 1 MG/ML IJ SOLN
1.0000 mg | INTRAMUSCULAR | Status: DC | PRN
  Administered 2020-06-22 (×2): 1 mg via INTRAVENOUS
  Administered 2020-06-22: 0.2 mg via INTRAVENOUS
  Administered 2020-06-23 (×3): 1 mg via INTRAVENOUS
  Filled 2020-06-22 (×8): qty 1

## 2020-06-22 MED ORDER — SODIUM CHLORIDE 0.9 % IV SOLN
12.5000 mg | Freq: Once | INTRAVENOUS | Status: AC
  Administered 2020-06-22: 12.5 mg via INTRAVENOUS
  Filled 2020-06-22: qty 0.5

## 2020-06-22 NOTE — H&P (Signed)
Robin Walker is a 25 y.o. female G3P1011 at [redacted]w[redacted]d presenting for evaluation of left flank pain.  Patient has h/o nephrolithiasis and has passed a few in this last week.  She has always been able to manage pain with Tylenol but this has become less effective today.  She presented to MAU and received 1 mg Dilaudid with minimal improvement in pain.  Renal u/s shows moderate bilateral hydronephrosis with no nephrolithiasis seen.  CT was performed which showed moderate bilateral hydronephrosis. There is a 5 x 6 mm left proximal ureteral stone at or just distal to the left UPJ. Mild right hydroureter with 4 mm distal right ureteral stone, several cm proximal to right UVJ.  Multiple intrarenal stones. WBC 14.8.  Patient has remained afebrile.  She experiences N/V during pain episodes.  Creatinine is 1.03.  Currently, patient reports minimal pain.  She has an appetite for the first time today and is eating dinner.  Active FM.  No CTX, VB or LOF.  Antepartum course is also complicated by hypothyroidism controlled on synthroid 125 mcg daily.  She has h/o C/S for macrosomia and plans repeat this pregnancy.    OB History    Gravida  3   Para  1   Term  1   Preterm      AB  1   Living  1     SAB  1   IAB      Ectopic      Multiple  0   Live Births  1          Past Medical History:  Diagnosis Date  . History of kidney stones   . Hypothyroidism   . PONV (postoperative nausea and vomiting)    Past Surgical History:  Procedure Laterality Date  . CESAREAN SECTION N/A 01/06/2018   Procedure: CESAREAN SECTION;  Surgeon: Candice Camp, MD;  Location: [facility] BIRTHING SUITES;  Service: Obstetrics;  Laterality: N/A;  Primary edc12/8 NKDA need RNFA  . CHOLECYSTECTOMY     Family History: family history includes Diabetes in her father and mother; Heart disease in her father; Hypertension in her father. Social History:  reports that she has never smoked. She has never used smokeless tobacco. She  reports previous alcohol use. She reports previous drug use.     Maternal Diabetes: No Genetic Screening: Normal Maternal Ultrasounds/Referrals: Normal Fetal Ultrasounds or other Referrals:  None Maternal Substance Abuse:  No Significant Maternal Medications:  Meds include: Syntroid Significant Maternal Lab Results:  Rh negative Other Comments:  None  Review of Systems Maternal Medical History:  Fetal activity: Perceived fetal activity is normal.   Last perceived fetal movement was within the past hour.    Prenatal complications: Nephrolithiasis.   Prenatal Complications - Diabetes: none.      Blood pressure 129/65, pulse (!) 103, temperature 98.4 F (36.9 C), temperature source Oral, resp. rate 18, height 5\' 3"  (1.6 m), weight 125.9 kg, SpO2 98 %, unknown if currently breastfeeding. Maternal Exam:  Abdomen: Surgical scars: low transverse.   Fundal height is c/w dates.       Fetal Exam Fetal Monitor Review: Baseline rate: 145.  Variability: moderate (6-25 bpm).   Pattern: accelerations present and no decelerations.    Fetal State Assessment: Category I - tracings are normal.     Physical Exam Constitutional:      Appearance: Normal appearance.  HENT:     Head: Normocephalic and atraumatic.  Abdominal:     Palpations: Abdomen is soft.  Tenderness: There is left CVA tenderness.  Musculoskeletal:        General: Normal range of motion.     Cervical back: Normal range of motion.  Skin:    General: Skin is warm and dry.  Neurological:     Mental Status: She is alert and oriented to person, place, and time.  Psychiatric:        Mood and Affect: Mood normal.        Behavior: Behavior normal.     Prenatal labs: ABO, Rh: --/--/PENDING (05/20 1830) Antibody: PENDING (05/20 1830) Rubella:   RPR:    HBsAg:    HIV:    GBS:     Assessment/Plan: 25yo G3P1011 at [redacted]w[redacted]d with nephrolithiasis -Admit for IVF and pain control -Rocephin empirically -U cx  pending -Will consult urology tomorrow  -Hypothyroid-continue levothyroxine -SCDs  Mitchel Honour 06/22/2020, 7:41 PM

## 2020-06-22 NOTE — MAU Note (Signed)
Robin Walker is a 25 y.o. at [redacted]w[redacted]d here in MAU reporting: on Wednesday started having mid left back pain. States she went to the office and they told her it probably was kidney stones and they gave her tylenol and a muscle relaxed. Pain is worse today and pain meds are not helping. No vaginal bleeding, LOF, or discharge. +FM  Onset of complaint: ongoing but worse  Pain score: 10/10  Vitals:   06/22/20 1347  BP: 133/75  Pulse: (!) 113  Resp: 18  Temp: 98 F (36.7 C)  SpO2: 99%     FHT: 138  Lab orders placed from triage: UA

## 2020-06-22 NOTE — Plan of Care (Signed)
  Problem: Nutrition: Goal: Adequate nutrition will be maintained Outcome: Progressing   

## 2020-06-22 NOTE — MAU Provider Note (Signed)
History     CSN: [id number]  Arrival date and time: 06/22/20 1329   Event Date/Time   First Provider Initiated Contact with Patient 06/22/20 1443      Chief Complaint  Patient presents with  . Back Pain   HPI  Ms.Robin Walker is a 25 y.o. female G3P1011 @ [redacted]w[redacted]d here in MAU with left upper back pain; patient has had several kidney stones in this pregnancy. The pain started Wednesday. The pain has gotten worse. She has tried tylenol and flexeril which is no longer helping. Initially the tylenol was helping, now it is not touching the pain. She rates her pain 10/10. The pain at times radiates around to her LLQ. The pain is constant. She reports passing a stone this morning with no resolution of pain. + N/V  OB History    Gravida  3   Para  1   Term  1   Preterm      AB  1   Living  1     SAB  1   IAB      Ectopic      Multiple  0   Live Births  1           Past Medical History:  Diagnosis Date  . History of kidney stones   . Hypothyroidism   . PONV (postoperative nausea and vomiting)     Past Surgical History:  Procedure Laterality Date  . CESAREAN SECTION N/A 01/06/2018   Procedure: CESAREAN SECTION;  Surgeon: Candice Camp, MD;  Location: [facility] BIRTHING SUITES;  Service: Obstetrics;  Laterality: N/A;  Primary edc12/8 NKDA need RNFA  . CHOLECYSTECTOMY      Family History  Problem Relation Age of Onset  . Diabetes Mother   . Heart disease Father   . Hypertension Father   . Diabetes Father     Social History   Tobacco Use  . Smoking status: Never Smoker  . Smokeless tobacco: Never Used  Vaping Use  . Vaping Use: Never used  Substance Use Topics  . Alcohol use: Not Currently  . Drug use: Not Currently    Allergies: No Known Allergies  Medications Prior to Admission  Medication Sig Dispense Refill Last Dose  . Cholecalciferol (VITAMIN D) 50 MCG (2000 UT) tablet Take 2,000 Units by mouth daily.   06/21/2020 at Unknown time  .  levothyroxine (SYNTHROID, LEVOTHROID) 125 MCG tablet Take 125 mcg by mouth daily before breakfast.   06/22/2020 at Unknown time  . Prenat w/o A-FE-Methfol-FA-DHA (PNV-DHA PO) Take 1 each by mouth daily.   06/22/2020 at Unknown time  . docusate sodium (COLACE) 100 MG capsule Take 1 capsule (100 mg total) by mouth 2 (two) times daily. 30 capsule 2   . ferrous sulfate 325 (65 FE) MG EC tablet Take 325 mg by mouth every evening.     Marland Kitchen ibuprofen (ADVIL,MOTRIN) 600 MG tablet Take 1 tablet (600 mg total) by mouth every 6 (six) hours as needed. 30 tablet 0   . oxyCODONE (OXY IR/ROXICODONE) 5 MG immediate release tablet Take 1 tablet (5 mg total) by mouth every 4 (four) hours as needed for severe pain. 15 tablet 0    Results for orders placed or performed during the hospital encounter of 06/22/20 (from the past 48 hour(s))  Urinalysis, Routine w reflex microscopic Urine, Clean Catch     Status: Abnormal   Collection Time: 06/22/20  1:58 PM  Result Value Ref Range   Color, Urine STRAW (A)  YELLOW   APPearance CLEAR CLEAR   Specific Gravity, Urine 1.005 1.005 - 1.030   pH 6.0 5.0 - 8.0   Glucose, UA NEGATIVE NEGATIVE mg/dL   Hgb urine dipstick MODERATE (A) NEGATIVE   Bilirubin Urine NEGATIVE NEGATIVE   Ketones, ur 20 (A) NEGATIVE mg/dL   Protein, ur NEGATIVE NEGATIVE mg/dL   Nitrite NEGATIVE NEGATIVE   Leukocytes,Ua TRACE (A) NEGATIVE   RBC / HPF 11-20 0 - 5 RBC/hpf   WBC, UA 0-5 0 - 5 WBC/hpf   Bacteria, UA RARE (A) NONE SEEN   Squamous Epithelial / LPF 0-5 0 - 5   Mucus PRESENT     Comment: Performed at [facility], [address] [place], [place] [address]  CBC with Differential/Platelet     Status: Abnormal   Collection Time: 06/22/20  3:06 PM  Result Value Ref Range   WBC 14.8 (H) 4.0 - 10.5 K/uL   RBC 3.66 (L) 3.87 - 5.11 MIL/uL   Hemoglobin 10.9 (L) 12.0 - 15.0 g/dL   HCT 60.433.0 (L) 54.036.0 - 98.146.0 %   MCV 90.2 80.0 - 100.0 fL   MCH 29.8 26.0 - 34.0 pg   MCHC 33.0 30.0 - 36.0 g/dL    RDW 19.113.6 47.811.5 - 29.515.5 %   Platelets 261 150 - 400 K/uL   nRBC 0.0 0.0 - 0.2 %   Neutrophils Relative % 85 %   Neutro Abs 12.6 (H) 1.7 - 7.7 K/uL   Lymphocytes Relative 8 %   Lymphs Abs 1.2 0.7 - 4.0 K/uL   Monocytes Relative 5 %   Monocytes Absolute 0.7 0.1 - 1.0 K/uL   Eosinophils Relative 1 %   Eosinophils Absolute 0.1 0.0 - 0.5 K/uL   Basophils Relative 0 %   Basophils Absolute 0.0 0.0 - 0.1 K/uL   Immature Granulocytes 1 %   Abs Immature Granulocytes 0.12 (H) 0.00 - 0.07 K/uL    Comment: Performed at [facility], [address] [place], [place] [address]  Comprehensive metabolic panel     Status: Abnormal   Collection Time: 06/22/20  3:06 PM  Result Value Ref Range   Sodium 136 135 - 145 mmol/L   Potassium 3.7 3.5 - 5.1 mmol/L   Chloride 104 98 - 111 mmol/L   CO2 22 22 - 32 mmol/L   Glucose, Bld 90 70 - 99 mg/dL    Comment: Glucose reference range applies only to samples taken after fasting for at least 8 hours.   BUN 5 (L) 6 - 20 mg/dL   Creatinine, Ser 8.651.03 (H) 0.44 - 1.00 mg/dL   Calcium 8.6 (L) 8.9 - 10.3 mg/dL   Total Protein 6.4 (L) 6.5 - 8.1 g/dL   Albumin 3.0 (L) 3.5 - 5.0 g/dL   AST 12 (L) 15 - 41 U/L   ALT 11 0 - 44 U/L   Alkaline Phosphatase 62 38 - 126 U/L   Total Bilirubin 0.3 0.3 - 1.2 mg/dL   GFR, Estimated >78>60 >46>60 mL/min    Comment: (NOTE) Calculated using the CKD-EPI Creatinine Equation (2021)    Anion gap 10 5 - 15    Comment: Performed at [facility], [address] [place], [place] [address]  Resp Panel by RT-PCR (Flu A&B, Covid) Nasopharyngeal Swab     Status: None   Collection Time: 06/22/20  4:42 PM   Specimen: Nasopharyngeal Swab; Nasopharyngeal(NP) swabs in vial transport medium  Result Value Ref Range   SARS  Coronavirus 2 by RT PCR NEGATIVE NEGATIVE    Comment: (NOTE) SARS-CoV-2 target nucleic acids are NOT DETECTED.  The SARS-CoV-2 RNA is generally detectable in upper respiratory specimens during the acute phase of  infection. The lowest concentration of SARS-CoV-2 viral copies this assay can detect is 138 copies/mL. A negative result does not preclude SARS-Cov-2 infection and should not be used as the sole basis for treatment or other patient management decisions. A negative result may occur with  improper specimen collection/handling, submission of specimen other than nasopharyngeal swab, presence of viral mutation(s) within the areas targeted by this assay, and inadequate number of viral copies(<138 copies/mL). A negative result must be combined with clinical observations, patient history, and epidemiological information. The expected result is Negative.  Fact Sheet for Patients:  BloggerCourse.com  Fact Sheet for Healthcare Providers:  SeriousBroker.it  This test is no t yet approved or cleared by the [place] FDA and  has been authorized for detection and/or diagnosis of SARS-CoV-2 by FDA under an Emergency Use Authorization (EUA). This EUA will remain  in effect (meaning this test can be used) for the duration of the COVID-19 declaration under Section 564(b)(1) of the Act, 21 U.S.C.section 360bbb-3(b)(1), unless the authorization is terminated  or revoked sooner.       Influenza A by PCR NEGATIVE NEGATIVE   Influenza B by PCR NEGATIVE NEGATIVE    Comment: (NOTE) The Xpert Xpress SARS-CoV-2/FLU/RSV plus assay is intended as an aid in the diagnosis of influenza from Nasopharyngeal swab specimens and should not be used as a sole basis for treatment. Nasal washings and aspirates are unacceptable for Xpert Xpress SARS-CoV-2/FLU/RSV testing.  Fact Sheet for Patients: BloggerCourse.com  Fact Sheet for Healthcare Providers: SeriousBroker.it  This test is not yet approved or cleared by the [place] FDA and has been authorized for detection and/or diagnosis of SARS-CoV-2 by FDA  under an Emergency Use Authorization (EUA). This EUA will remain in effect (meaning this test can be used) for the duration of the COVID-19 declaration under Section 564(b)(1) of the Act, 21 U.S.C. section 360bbb-3(b)(1), unless the authorization is terminated or revoked.  Performed at [facility], [address] [place], [place] [address]   Type and screen [facility]     Status: None   Collection Time: 06/22/20  6:30 PM  Result Value Ref Range   ABO/RH(D) O NEG    Antibody Screen POS    Sample Expiration      06/25/2020,2359 Performed at [facility], [address], [place], [place] [address]    US RENAL  Result Date: 06/22/2020 CLINICAL DATA:  History of kidney stones, back pain since Wednesday EXAM: RENAL / URINARY TRACT ULTRASOUND COMPLETE COMPARISON:  None. FINDINGS: Right Kidney: Renal measurements: 14.3 x 6.9 x 7.0 cm = volume: 362.3 mL. There is moderate hydronephrosis of the right kidney. Left Kidney: Renal measurements: 15.8 x 8.4 x 8.5 cm = volume: 586.3 mL. There is moderate hydronephrosis of the left kidney. Bladder: Mildly distended with limited images and poor visualization. Other: None. IMPRESSION: Moderate bilateral hydronephrosis. No nephrolithiasis visualized sonographically. Electronically Signed   By: Caprice Renshaw   On: 06/22/2020 16:11   CT RENAL STONE STUDY  Result Date: 06/22/2020 CLINICAL DATA:  Flank pain with hematuria [redacted] weeks pregnant EXAM: CT ABDOMEN AND PELVIS WITHOUT CONTRAST TECHNIQUE: Multidetector CT imaging of the abdomen and pelvis was performed following the standard protocol without IV contrast. Informed consent obtained. COMPARISON:  Ultrasound  06/22/2020 FINDINGS: Lower chest: Lung bases demonstrate no acute consolidation. No pleural effusion. Normal cardiac size. Hepatobiliary: No focal liver abnormality is seen. Status post cholecystectomy. No biliary dilatation. Pancreas: Unremarkable. No pancreatic ductal dilatation  or surrounding inflammatory changes. Spleen: Normal in size without focal abnormality. Adrenals/Urinary Tract: Adrenal glands are normal. Multiple bilateral intrarenal calculi, measuring up to 2 mm lower pole right kidney and 3 mm lower pole left kidney. Moderate bilateral hydronephrosis. 5 x 6 mm proximal ureteral stone on the left, just distal to the UPJ. Mild right hydroureter, secondary to a 4 mm stone in the distal right ureter several cm proximal to the right UVJ. Bladder unremarkable Stomach/Bowel: Stomach is within normal limits. Appendix appears normal. No evidence of bowel wall thickening, distention, or inflammatory changes. Vascular/Lymphatic: No significant vascular findings are present. No enlarged abdominal or pelvic lymph nodes. Reproductive: Gravid uterus with single IUP.  No adnexal masses. Other: Negative for free air or free fluid Musculoskeletal: No acute or significant osseous findings. IMPRESSION: 1. Moderate bilateral hydronephrosis. There is a 5 x 6 mm left proximal ureteral stone at or just distal to the left UPJ. Mild right hydroureter with 4 mm distal right ureteral stone, several cm proximal to right UVJ. 2. Multiple intrarenal stones. Electronically Signed   By: Jasmine Pang M.D.   On: 06/22/2020 18:12   Review of Systems  Constitutional: Positive for appetite change.  Gastrointestinal: Positive for nausea and vomiting.  Genitourinary: Positive for flank pain and urgency.   Physical Exam   Blood pressure 136/75, pulse (!) 112, temperature 98 F (36.7 C), temperature source Oral, resp. rate 18, height 5\' 3"  (1.6 m), weight 125.9 kg, SpO2 99 %, unknown if currently breastfeeding.  Physical Exam Vitals and nursing note reviewed.  Constitutional:      General: She is in acute distress.     Appearance: She is ill-appearing and diaphoretic. She is not toxic-appearing.  Eyes:     Pupils: Pupils are equal, round, and reactive to light.  Pulmonary:     Effort: Pulmonary  effort is normal.  Abdominal:     Tenderness: There is no abdominal tenderness. There is left CVA tenderness. There is no right CVA tenderness.  Musculoskeletal:        General: Normal range of motion.  Skin:    General: Skin is warm.  Neurological:     Mental Status: She is alert and oriented to person, place, and time.  Psychiatric:        Mood and Affect: Mood normal.   Fetal Tracing: Baseline: 135 bpm Variability: Moderate  Accelerations: 15x15 Decelerations: variable  Toco: none   MAU Course  Procedures  None  MDM  Renal ordered Dilaudid 1 mg IV & Zofran 8 mg IV.  Some pain relief following Iv dilaudid. Reviewed patient with on-call Urologist Dr. [place] who recommends CT/ stone study.  Reviewed patient with Dr. Charlestine Massed and recommended admission for [facility] speciality   Assessment and Plan   1. Acute flank pain   2. History of kidney stones   3. [redacted] weeks gestation of pregnancy   4. Hematuria, unspecified type     P:  Admit to OB speciality CT scan ordered Rocephin IV  Urine culture ordered an pending Dr. [facility] to place orders  Melvinia Ashby, Langston Masker, NP 06/22/2020 8:07 PM

## 2020-06-23 ENCOUNTER — Observation Stay (HOSPITAL_COMMUNITY): Payer: BC Managed Care – PPO

## 2020-06-23 DIAGNOSIS — O34219 Maternal care for unspecified type scar from previous cesarean delivery: Secondary | ICD-10-CM | POA: Diagnosis present

## 2020-06-23 DIAGNOSIS — E039 Hypothyroidism, unspecified: Secondary | ICD-10-CM | POA: Diagnosis present

## 2020-06-23 DIAGNOSIS — O26833 Pregnancy related renal disease, third trimester: Secondary | ICD-10-CM | POA: Diagnosis present

## 2020-06-23 DIAGNOSIS — O26893 Other specified pregnancy related conditions, third trimester: Secondary | ICD-10-CM | POA: Diagnosis present

## 2020-06-23 DIAGNOSIS — Z6791 Unspecified blood type, Rh negative: Secondary | ICD-10-CM | POA: Diagnosis not present

## 2020-06-23 DIAGNOSIS — Z3A28 28 weeks gestation of pregnancy: Secondary | ICD-10-CM | POA: Diagnosis not present

## 2020-06-23 DIAGNOSIS — O99283 Endocrine, nutritional and metabolic diseases complicating pregnancy, third trimester: Secondary | ICD-10-CM | POA: Diagnosis present

## 2020-06-23 DIAGNOSIS — N132 Hydronephrosis with renal and ureteral calculous obstruction: Secondary | ICD-10-CM | POA: Diagnosis present

## 2020-06-23 DIAGNOSIS — Z87442 Personal history of urinary calculi: Secondary | ICD-10-CM | POA: Diagnosis not present

## 2020-06-23 DIAGNOSIS — Z20822 Contact with and (suspected) exposure to covid-19: Secondary | ICD-10-CM | POA: Diagnosis present

## 2020-06-23 HISTORY — PX: IR NEPHROSTOMY PLACEMENT LEFT: IMG6063

## 2020-06-23 LAB — CBC
HCT: 27.7 % — ABNORMAL LOW (ref 36.0–46.0)
Hemoglobin: 9.3 g/dL — ABNORMAL LOW (ref 12.0–15.0)
MCH: 30.2 pg (ref 26.0–34.0)
MCHC: 33.6 g/dL (ref 30.0–36.0)
MCV: 89.9 fL (ref 80.0–100.0)
Platelets: 227 10*3/uL (ref 150–400)
RBC: 3.08 MIL/uL — ABNORMAL LOW (ref 3.87–5.11)
RDW: 14 % (ref 11.5–15.5)
WBC: 10.1 10*3/uL (ref 4.0–10.5)
nRBC: 0 % (ref 0.0–0.2)

## 2020-06-23 LAB — CULTURE, OB URINE: Culture: <10000 — AB

## 2020-06-23 MED ORDER — IOHEXOL 300 MG/ML  SOLN
50.0000 mL | Freq: Once | INTRAMUSCULAR | Status: AC | PRN
  Administered 2020-06-23: 20 mL

## 2020-06-23 MED ORDER — TAMSULOSIN HCL 0.4 MG PO CAPS
0.4000 mg | ORAL_CAPSULE | Freq: Every day | ORAL | Status: DC
  Administered 2020-06-23 – 2020-06-24 (×2): 0.4 mg via ORAL
  Filled 2020-06-23 (×2): qty 1

## 2020-06-23 MED ORDER — FENTANYL CITRATE (PF) 100 MCG/2ML IJ SOLN
INTRAMUSCULAR | Status: AC
  Filled 2020-06-23: qty 4

## 2020-06-23 MED ORDER — LIDOCAINE HCL (PF) 1 % IJ SOLN
INTRAMUSCULAR | Status: AC
  Filled 2020-06-23: qty 30

## 2020-06-23 MED ORDER — FENTANYL CITRATE (PF) 100 MCG/2ML IJ SOLN
INTRAMUSCULAR | Status: AC | PRN
  Administered 2020-06-23 (×2): 50 ug via INTRAVENOUS

## 2020-06-23 NOTE — Consult Note (Signed)
Urology Consult   Physician requesting consult: Dr Langston MaskerMorris  Reason for consult: Bilateral ureteral calculi and left flank pain  History of Present Illness: Robin Walker is a 25 y.o. with 28-week IUP, presents to the hospital last night with severe left-sided flank pain which been present for the last day or 2.  This became associated with nausea and vomiting but no fever or gross hematuria.  She has had prior history of stones and has passed several stones during her pregnancy by her report.  Patient was admitted for pain control and CT urogram was obtained.  This shows a 5 x 6 mm left proximal ureteral stone just distal to the left ureteropelvic junction.  There is also a 4 mm right distal ureteral calculus several centimeters proximal to the right UVJ.  There are multiple intrarenal stones noted which are nonobstructing.  She denies a history of voiding or storage urinary symptoms, hematuria, UTIs, STDs, urolithiasis, GU malignancy/trauma/surgery.  Past Medical History:  Diagnosis Date  . History of kidney stones   . Hypothyroidism   . PONV (postoperative nausea and vomiting)     Past Surgical History:  Procedure Laterality Date  . CESAREAN SECTION N/A 01/06/2018   Procedure: CESAREAN SECTION;  Surgeon: Candice CampLowe, David, MD;  [facility];  Service: Obstetrics;  Laterality: N/A;  Primary edc12/8 NKDA need RNFA  . CHOLECYSTECTOMY       Current Hospital Medications:  Home meds:  No current facility-administered medications on file prior to encounter.   Current Outpatient Medications on File Prior to Encounter  Medication Sig Dispense Refill  . Cholecalciferol (VITAMIN D) 50 MCG (2000 UT) tablet Take 2,000 Units by mouth daily.    Marland Kitchen. levothyroxine (SYNTHROID, LEVOTHROID) 125 MCG tablet Take 125 mcg by mouth daily before breakfast.    . Prenat w/o A-FE-Methfol-FA-DHA (PNV-DHA PO) Take 1 each by mouth daily.    Marland Kitchen. docusate sodium (COLACE) 100 MG capsule Take 1  capsule (100 mg total) by mouth 2 (two) times daily. 30 capsule 2  . ferrous sulfate 325 (65 FE) MG EC tablet Take 325 mg by mouth every evening.    Marland Kitchen. ibuprofen (ADVIL,MOTRIN) 600 MG tablet Take 1 tablet (600 mg total) by mouth every 6 (six) hours as needed. 30 tablet 0  . oxyCODONE (OXY IR/ROXICODONE) 5 MG immediate release tablet Take 1 tablet (5 mg total) by mouth every 4 (four) hours as needed for severe pain. 15 tablet 0     Scheduled Meds: . docusate sodium  100 mg Oral Daily  . levothyroxine  125 mcg Oral Q0600  . prenatal multivitamin  1 tablet Oral Q1200   Continuous Infusions: . cefTRIAXone (ROCEPHIN)  IV 2 g (06/22/20 1854)  . lactated ringers 125 mL/hr at 06/23/20 0435  . ondansetron (ZOFRAN) IV     PRN Meds:.acetaminophen, calcium carbonate, HYDROmorphone (DILAUDID) injection, ondansetron (ZOFRAN) IV, zolpidem  Allergies: No Known Allergies  Family History  Problem Relation Age of Onset  . Diabetes Mother   . Heart disease Father   . Hypertension Father   . Diabetes Father     Social History:  reports that she has never smoked. She has never used smokeless tobacco. She reports previous alcohol use. She reports previous drug use.  ROS: A complete review of systems was performed.  All systems are negative except for pertinent findings as noted.  Physical Exam:  Vital signs in last 24 hours: Temp:  [97.9 F (36.6 C)-98.4 F (36.9 C)] 98.1 F (36.7 C) (05/21  4098) Pulse Rate:  [91-115] 97 (05/21 0426) Resp:  [15-18] 17 (05/21 0733) BP: (120-136)/(58-75) 125/58 (05/21 0426) SpO2:  [98 %-99 %] 99 % (05/21 0426) Weight:  [125.9 kg] 125.9 kg (05/20 1342) Constitutional:  Alert and oriented, No acute distress Cardiovascular: Regular rate and rhythm, No JVD Respiratory: Normal respiratory effort, Lungs clear bilaterally GI: Abdomen positive for intrauterine pregnancy GU: Left CVA tenderness Lymphatic: No lymphadenopathy Neurologic: Grossly intact, no focal  deficits Psychiatric: Normal mood and affect  Laboratory Data:  Recent Labs    06/22/20 1506 06/23/20 0521  WBC 14.8* 10.1  HGB 10.9* 9.3*  HCT 33.0* 27.7*  PLT 261 227    Recent Labs    06/22/20 1506  NA 136  K 3.7  CL 104  GLUCOSE 90  BUN 5*  CALCIUM 8.6*  CREATININE 1.03*     Results for orders placed or performed during the hospital encounter of 06/22/20 (from the past 24 hour(s))  Urinalysis, Routine w reflex microscopic Urine, Clean Catch     Status: Abnormal   Collection Time: 06/22/20  1:58 PM  Result Value Ref Range   Color, Urine STRAW (A) YELLOW   APPearance CLEAR CLEAR   Specific Gravity, Urine 1.005 1.005 - 1.030   pH 6.0 5.0 - 8.0   Glucose, UA NEGATIVE NEGATIVE mg/dL   Hgb urine dipstick MODERATE (A) NEGATIVE   Bilirubin Urine NEGATIVE NEGATIVE   Ketones, ur 20 (A) NEGATIVE mg/dL   Protein, ur NEGATIVE NEGATIVE mg/dL   Nitrite NEGATIVE NEGATIVE   Leukocytes,Ua TRACE (A) NEGATIVE   RBC / HPF 11-20 0 - 5 RBC/hpf   WBC, UA 0-5 0 - 5 WBC/hpf   Bacteria, UA RARE (A) NONE SEEN   Squamous Epithelial / LPF 0-5 0 - 5   Mucus PRESENT   CBC with Differential/Platelet     Status: Abnormal   Collection Time: 06/22/20  3:06 PM  Result Value Ref Range   WBC 14.8 (H) 4.0 - 10.5 K/uL   RBC 3.66 (L) 3.87 - 5.11 MIL/uL   Hemoglobin 10.9 (L) 12.0 - 15.0 g/dL   HCT 11.9 (L) 14.7 - 82.9 %   MCV 90.2 80.0 - 100.0 fL   MCH 29.8 26.0 - 34.0 pg   MCHC 33.0 30.0 - 36.0 g/dL   RDW 56.2 13.0 - 86.5 %   Platelets 261 150 - 400 K/uL   nRBC 0.0 0.0 - 0.2 %   Neutrophils Relative % 85 %   Neutro Abs 12.6 (H) 1.7 - 7.7 K/uL   Lymphocytes Relative 8 %   Lymphs Abs 1.2 0.7 - 4.0 K/uL   Monocytes Relative 5 %   Monocytes Absolute 0.7 0.1 - 1.0 K/uL   Eosinophils Relative 1 %   Eosinophils Absolute 0.1 0.0 - 0.5 K/uL   Basophils Relative 0 %   Basophils Absolute 0.0 0.0 - 0.1 K/uL   Immature Granulocytes 1 %   Abs Immature Granulocytes 0.12 (H) 0.00 - 0.07 K/uL   Comprehensive metabolic panel     Status: Abnormal   Collection Time: 06/22/20  3:06 PM  Result Value Ref Range   Sodium 136 135 - 145 mmol/L   Potassium 3.7 3.5 - 5.1 mmol/L   Chloride 104 98 - 111 mmol/L   CO2 22 22 - 32 mmol/L   Glucose, Bld 90 70 - 99 mg/dL   BUN 5 (L) 6 - 20 mg/dL   Creatinine, Ser 7.84 (H) 0.44 - 1.00 mg/dL   Calcium 8.6 (L) 8.9 -  10.3 mg/dL   Total Protein 6.4 (L) 6.5 - 8.1 g/dL   Albumin 3.0 (L) 3.5 - 5.0 g/dL   AST 12 (L) 15 - 41 U/L   ALT 11 0 - 44 U/L   Alkaline Phosphatase 62 38 - 126 U/L   Total Bilirubin 0.3 0.3 - 1.2 mg/dL   GFR, Estimated >75 >10 mL/min   Anion gap 10 5 - 15  Resp Panel by RT-PCR (Flu A&B, Covid) Nasopharyngeal Swab     Status: None   Collection Time: 06/22/20  4:42 PM   Specimen: Nasopharyngeal Swab; Nasopharyngeal(NP) swabs in vial transport medium  Result Value Ref Range   SARS Coronavirus 2 by RT PCR NEGATIVE NEGATIVE   Influenza A by PCR NEGATIVE NEGATIVE   Influenza B by PCR NEGATIVE NEGATIVE  Type and screen [facility]     Status: None   Collection Time: 06/22/20  6:30 PM  Result Value Ref Range   ABO/RH(D) O NEG    Antibody Screen POS    Sample Expiration 06/25/2020,2359    Antibody Identification      PASSIVELY ACQUIRED ANTI-D Performed at [facility], [address], [place], [place] [address]   CBC     Status: Abnormal   Collection Time: 06/23/20  5:21 AM  Result Value Ref Range   WBC 10.1 4.0 - 10.5 K/uL   RBC 3.08 (L) 3.87 - 5.11 MIL/uL   Hemoglobin 9.3 (L) 12.0 - 15.0 g/dL   HCT 77.8 (L) 24.2 - 35.3 %   MCV 89.9 80.0 - 100.0 fL   MCH 30.2 26.0 - 34.0 pg   MCHC 33.6 30.0 - 36.0 g/dL   RDW 61.4 43.1 - 54.0 %   Platelets 227 150 - 400 K/uL   nRBC 0.0 0.0 - 0.2 %   Recent Results (from the past 240 hour(s))  Resp Panel by RT-PCR (Flu A&B, Covid) Nasopharyngeal Swab     Status: None   Collection Time: 06/22/20  4:42 PM   Specimen: Nasopharyngeal Swab; Nasopharyngeal(NP) swabs in  vial transport medium  Result Value Ref Range Status   SARS Coronavirus 2 by RT PCR NEGATIVE NEGATIVE Final    Comment: (NOTE) SARS-CoV-2 target nucleic acids are NOT DETECTED.  The SARS-CoV-2 RNA is generally detectable in upper respiratory specimens during the acute phase of infection. The lowest concentration of SARS-CoV-2 viral copies this assay can detect is 138 copies/mL. A negative result does not preclude SARS-Cov-2 infection and should not be used as the sole basis for treatment or other patient management decisions. A negative result may occur with  improper specimen collection/handling, submission of specimen other than nasopharyngeal swab, presence of viral mutation(s) within the areas targeted by this assay, and inadequate number of viral copies(<138 copies/mL). A negative result must be combined with clinical observations, patient history, and epidemiological information. The expected result is Negative.  Fact Sheet for Patients:  BloggerCourse.com  Fact Sheet for Healthcare Providers:  SeriousBroker.it  This test is no t yet approved or cleared by the [place] FDA and  has been authorized for detection and/or diagnosis of SARS-CoV-2 by FDA under an Emergency Use Authorization (EUA). This EUA will remain  in effect (meaning this test can be used) for the duration of the COVID-19 declaration under Section 564(b)(1) of the Act, 21 U.S.C.section 360bbb-3(b)(1), unless the authorization is terminated  or revoked sooner.       Influenza A by PCR NEGATIVE NEGATIVE Final   Influenza B by  PCR NEGATIVE NEGATIVE Final    Comment: (NOTE) The Xpert Xpress SARS-CoV-2/FLU/RSV plus assay is intended as an aid in the diagnosis of influenza from Nasopharyngeal swab specimens and should not be used as a sole basis for treatment. Nasal washings and aspirates are unacceptable for Xpert Xpress SARS-CoV-2/FLU/RSV testing.  Fact  Sheet for Patients: BloggerCourse.com  Fact Sheet for Healthcare Providers: SeriousBroker.it  This test is not yet approved or cleared by the [place] FDA and has been authorized for detection and/or diagnosis of SARS-CoV-2 by FDA under an Emergency Use Authorization (EUA). This EUA will remain in effect (meaning this test can be used) for the duration of the COVID-19 declaration under Section 564(b)(1) of the Act, 21 U.S.C. section 360bbb-3(b)(1), unless the authorization is terminated or revoked.  Performed at [facility], [address] [place], [place] [address]     Renal Function: Recent Labs    06/22/20 1506  CREATININE 1.03*   Estimated Creatinine Clearance: 105.9 mL/min (A) (by C-G formula based on SCr of 1.03 mg/dL (H)).  Radiologic Imaging: US RENAL  Result Date: 06/22/2020 CLINICAL DATA:  History of kidney stones, back pain since Wednesday EXAM: RENAL / URINARY TRACT ULTRASOUND COMPLETE COMPARISON:  None. FINDINGS: Right Kidney: Renal measurements: 14.3 x 6.9 x 7.0 cm = volume: 362.3 mL. There is moderate hydronephrosis of the right kidney. Left Kidney: Renal measurements: 15.8 x 8.4 x 8.5 cm = volume: 586.3 mL. There is moderate hydronephrosis of the left kidney. Bladder: Mildly distended with limited images and poor visualization. Other: None. IMPRESSION: Moderate bilateral hydronephrosis. No nephrolithiasis visualized sonographically. Electronically Signed   By: Caprice Renshaw   On: 06/22/2020 16:11   CT RENAL STONE STUDY  Result Date: 06/22/2020 CLINICAL DATA:  Flank pain with hematuria [redacted] weeks pregnant EXAM: CT ABDOMEN AND PELVIS WITHOUT CONTRAST TECHNIQUE: Multidetector CT imaging of the abdomen and pelvis was performed following the standard protocol without IV contrast. Informed consent obtained. COMPARISON:  Ultrasound 06/22/2020 FINDINGS: Lower chest: Lung bases demonstrate no acute consolidation. No  pleural effusion. Normal cardiac size. Hepatobiliary: No focal liver abnormality is seen. Status post cholecystectomy. No biliary dilatation. Pancreas: Unremarkable. No pancreatic ductal dilatation or surrounding inflammatory changes. Spleen: Normal in size without focal abnormality. Adrenals/Urinary Tract: Adrenal glands are normal. Multiple bilateral intrarenal calculi, measuring up to 2 mm lower pole right kidney and 3 mm lower pole left kidney. Moderate bilateral hydronephrosis. 5 x 6 mm proximal ureteral stone on the left, just distal to the UPJ. Mild right hydroureter, secondary to a 4 mm stone in the distal right ureter several cm proximal to the right UVJ. Bladder unremarkable Stomach/Bowel: Stomach is within normal limits. Appendix appears normal. No evidence of bowel wall thickening, distention, or inflammatory changes. Vascular/Lymphatic: No significant vascular findings are present. No enlarged abdominal or pelvic lymph nodes. Reproductive: Gravid uterus with single IUP.  No adnexal masses. Other: Negative for free air or free fluid Musculoskeletal: No acute or significant osseous findings. IMPRESSION: 1. Moderate bilateral hydronephrosis. There is a 5 x 6 mm left proximal ureteral stone at or just distal to the left UPJ. Mild right hydroureter with 4 mm distal right ureteral stone, several cm proximal to right UVJ. 2. Multiple intrarenal stones. Electronically Signed   By: Jasmine Pang M.D.   On: 06/22/2020 18:12    I independently reviewed the above imaging studies.  Impression/Recommendation: Bilateral ureteral calculi with the patient at 28-week IUP Plan/recommendation: I discussed and reviewed her CT scan in detail  today.  I recommended implementation of tamsulosin 0.4 mg daily as medical expulsive therapy.  I also recommended left nephrostomy tube to maintain adequate renal function but she has bilateral ureteral calculi.  Also for pain management.  She is minimally symptomatic on the right  side and so intervention probably not needed at this point.  We discussed the need for possible nephrostomy tube through duration of pregnancy.  We will discuss with interventional radiology, would be hopeful they may be able to do under IV sedation and ultrasound guidance.  After discussion with patient she is agreeable.  Belva Agee 06/23/2020, 7:46 AM     CC:

## 2020-06-23 NOTE — Consult Note (Signed)
Chief Complaint: Patient was seen in consultation today for left percutaneous nephrostomy placement.  Referring Physician(s): Karoline CaldwellNewsome, George  Supervising Physician: Malachy MoanMcCullough, Heath  [facility]  History of Present Illness: Robin Walker is a 25 y.o. female with a past medical history significant for hypothyroidism and previous kidney stone, she is currently [redacted] weeks pregnant with a boy, who presented to the [facility]U yesterday with complaints of severe left sided flank pain, nausea and vomiting. She was noted to have a 5 x 6 mm left proximal ureteral stone just distal to the left ureteropelvic junction, a 4 mm right distal ureteral calculus proximal to the right ureteropelvic junction, multiple nonobstructing intrarenal stones and moderate bilateral hydronephrosis. She was seen by urology who has recommended left PCN placement to maintain adequate renal function and for pain management.   Robin Walker seen in her room today, she reports being nervous about the procedure but understands why she needs it. She asks that I speak to her mom via FaceTime as she is in [place] right now. She is wondering if the tube will need to stay for her whole pregnancy and if so when it could be removed after she has the baby. She is wondering if she will be able to work and if the tube will be able to be seen under her clothes. She asks if she will be able to urinate after the procedure. All questions answered to the best of my ability and she is in agreement to proceed.   Past Medical History:  Diagnosis Date  . History of kidney stones   . Hypothyroidism   . PONV (postoperative nausea and vomiting)     Past Surgical History:  Procedure Laterality Date  . CESAREAN SECTION N/A 01/06/2018   Procedure: CESAREAN SECTION;  Surgeon: Candice CampLowe, David, MD;  [facility];  Service: Obstetrics;  Laterality: N/A;  Primary edc12/8 NKDA need RNFA  . CHOLECYSTECTOMY       Allergies: Patient has no known allergies.  Medications: Prior to Admission medications   Medication Sig Start Date End Date Taking? Authorizing Provider  Cholecalciferol (VITAMIN D) 50 MCG (2000 UT) tablet Take 2,000 Units by mouth daily.   Yes [provider]  levothyroxine (SYNTHROID, LEVOTHROID) 125 MCG tablet Take 125 mcg by mouth daily before breakfast.   Yes [provider]  Prenat w/o A-FE-Methfol-FA-DHA (PNV-DHA PO) Take 1 each by mouth daily.   Yes [provider]  docusate sodium (COLACE) 100 MG capsule Take 1 capsule (100 mg total) by mouth 2 (two) times daily. 01/09/18   Ranae PilaLeger, Elise Jennifer, MD  ferrous sulfate 325 (65 FE) MG EC tablet Take 325 mg by mouth every evening.    [provider]  ibuprofen (ADVIL,MOTRIN) 600 MG tablet Take 1 tablet (600 mg total) by mouth every 6 (six) hours as needed. 01/09/18   Ranae PilaLeger, Elise Jennifer, MD  oxyCODONE (OXY IR/ROXICODONE) 5 MG immediate release tablet Take 1 tablet (5 mg total) by mouth every 4 (four) hours as needed for severe pain. 01/09/18   Ranae PilaLeger, Elise Jennifer, MD     Family History  Problem Relation Age of Onset  . Diabetes Mother   . Heart disease Father   . Hypertension Father   . Diabetes Father     Social History   Socioeconomic History  . Marital status: Married    Spouse name: Not on file  . Number of children: Not on file  . Years of education: Not on file  .  Highest education level: Not on file  Occupational History  . Not on file  Tobacco Use  . Smoking status: Never Smoker  . Smokeless tobacco: Never Used  Vaping Use  . Vaping Use: Never used  Substance and Sexual Activity  . Alcohol use: Not Currently  . Drug use: Not Currently  . Sexual activity: Not Currently  Other Topics Concern  . Not on file  Social History Narrative  . Not on file   Social Determinants of Health   Financial Resource Strain: Not on file  Food Insecurity: Not on file  Transportation  Needs: Not on file  Physical Activity: Not on file  Stress: Not on file  Social Connections: Not on file     Review of Systems: A 12 point ROS discussed and pertinent positives are indicated in the HPI above.  All other systems are negative.  Review of Systems  Constitutional: Negative for chills and fever.  Respiratory: Negative for cough and shortness of breath.   Cardiovascular: Negative for chest pain.  Gastrointestinal: Negative for abdominal pain, nausea and vomiting.  Genitourinary: Positive for flank pain. Negative for hematuria.  Neurological: Negative for dizziness and headaches.    Vital Signs: BP 128/64 (BP Location: Right Arm)   Pulse 100   Temp 98 F (36.7 C) (Oral)   Resp 17   Ht 5\' 3"  (1.6 m)   Wt 277 lb 9.6 oz (125.9 kg)   SpO2 100%   BMI 49.17 kg/m   Physical Exam Vitals and nursing note reviewed.  Constitutional:      General: She is not in acute distress. HENT:     Head: Normocephalic.     Mouth/Throat:     Mouth: Mucous membranes are moist.     Pharynx: Oropharynx is clear. No oropharyngeal exudate or posterior oropharyngeal erythema.  Cardiovascular:     Rate and Rhythm: Normal rate and regular rhythm.  Pulmonary:     Effort: Pulmonary effort is normal.     Breath sounds: Normal breath sounds.  Abdominal:     Tenderness: There is left CVA tenderness. There is no right CVA tenderness.  Skin:    General: Skin is warm and dry.  Neurological:     Mental Status: She is alert and oriented to person, place, and time.  Psychiatric:        Mood and Affect: Mood normal.        Behavior: Behavior normal.        Thought Content: Thought content normal.        Judgment: Judgment normal.      MD Evaluation Airway: WNL Heart: WNL Abdomen: Other (comments) Abdomen comments: [redacted] weeks pregnant Chest/ Lungs: WNL ASA  Classification: 2 Mallampati/Airway Score: One   Imaging: RENAL  Result Date: 06/22/2020 CLINICAL DATA:  History of kidney  stones, back pain since Wednesday EXAM: RENAL / URINARY TRACT ULTRASOUND COMPLETE COMPARISON:  None. FINDINGS: Right Kidney: Renal measurements: 14.3 x 6.9 x 7.0 cm = volume: 362.3 mL. There is moderate hydronephrosis of the right kidney. Left Kidney: Renal measurements: 15.8 x 8.4 x 8.5 cm = volume: 586.3 mL. There is moderate hydronephrosis of the left kidney. Bladder: Mildly distended with limited images and poor visualization. Other: None. IMPRESSION: Moderate bilateral hydronephrosis. No nephrolithiasis visualized sonographically. Electronically Signed   By: Friday   On: 06/22/2020 16:11   CT RENAL STONE STUDY  Result Date: 06/22/2020 CLINICAL DATA:  Flank pain with hematuria [redacted] weeks pregnant EXAM: CT ABDOMEN  AND PELVIS WITHOUT CONTRAST TECHNIQUE: Multidetector CT imaging of the abdomen and pelvis was performed following the standard protocol without IV contrast. Informed consent obtained. COMPARISON:  Ultrasound 06/22/2020 FINDINGS: Lower chest: Lung bases demonstrate no acute consolidation. No pleural effusion. Normal cardiac size. Hepatobiliary: No focal liver abnormality is seen. Status post cholecystectomy. No biliary dilatation. Pancreas: Unremarkable. No pancreatic ductal dilatation or surrounding inflammatory changes. Spleen: Normal in size without focal abnormality. Adrenals/Urinary Tract: Adrenal glands are normal. Multiple bilateral intrarenal calculi, measuring up to 2 mm lower pole right kidney and 3 mm lower pole left kidney. Moderate bilateral hydronephrosis. 5 x 6 mm proximal ureteral stone on the left, just distal to the UPJ. Mild right hydroureter, secondary to a 4 mm stone in the distal right ureter several cm proximal to the right UVJ. Bladder unremarkable Stomach/Bowel: Stomach is within normal limits. Appendix appears normal. No evidence of bowel wall thickening, distention, or inflammatory changes. Vascular/Lymphatic: No significant vascular findings are present. No enlarged  abdominal or pelvic lymph nodes. Reproductive: Gravid uterus with single IUP.  No adnexal masses. Other: Negative for free air or free fluid Musculoskeletal: No acute or significant osseous findings. IMPRESSION: 1. Moderate bilateral hydronephrosis. There is a 5 x 6 mm left proximal ureteral stone at or just distal to the left UPJ. Mild right hydroureter with 4 mm distal right ureteral stone, several cm proximal to right UVJ. 2. Multiple intrarenal stones. Electronically Signed   By: Jasmine Pang M.D.   On: 06/22/2020 18:12    Labs:  CBC: Recent Labs    06/22/20 1506 06/23/20 0521  WBC 14.8* 10.1  HGB 10.9* 9.3*  HCT 33.0* 27.7*  PLT 261 227    COAGS: No results for input(s): INR, APTT in the last 8760 hours.  BMP: Recent Labs    06/22/20 1506  NA 136  K 3.7  CL 104  CO2 22  GLUCOSE 90  BUN 5*  CALCIUM 8.6*  CREATININE 1.03*  GFRNONAA >60    LIVER FUNCTION TESTS: Recent Labs    06/22/20 1506  BILITOT 0.3  AST 12*  ALT 11  ALKPHOS 62  PROT 6.4*  ALBUMIN 3.0*    TUMOR MARKERS: No results for input(s): AFPTM, CEA, CA199, CHROMGRNA in the last 8760 hours.  Assessment and Plan:  25 y/o F with previous history of kidney stones that were able to be passed who is currently [redacted] weeks pregnant with a boy who presented to [facility]U yesterday with complaints of severe left flank pain w/o hematuria, n/v. She was found to have bilateral renal calculi and bilateral hydronephrosis - she was seen by urology this morning who has recommended left PCN placement to maintain adequate renal function and for pain control, currently right side is minimally tender so no intervention planned on that side at this time.  Discussed procedure with patient and her mother via FaceTime who are both agreeable to proceed. I will follow up with patient tomorrow to discuss PCN management and outpatient follow up.  Risks and benefits of left PCN placement was discussed with the patient including, but not  limited to, infection, bleeding, significant bleeding causing loss or decrease in renal function or damage to adjacent structures.   All of the patient's questions were answered, patient is agreeable to proceed.  Consent signed and in chart.   Thank you for this interesting consult.  I greatly enjoyed meeting [facility] and look forward to participating in their care.  A copy of this report was sent  to the requesting provider on this date.  Electronically Signed: Villa Herb, PA-C 06/23/2020, 10:49 AM   I spent a total of 40 Minutes  in face to face in clinical consultation, greater than 50% of which was counseling/coordinating care for left PCN placement.

## 2020-06-23 NOTE — Progress Notes (Signed)
Patient reports intermittent pain overnight which required narcotics (Dilaudid).  She reports active FM.  No LOF, VB, or CTX.  She denies fever, chills.  No N/V unless pain.    VSS. AF. NST reassuring  CBC Latest Ref Rng & Units 06/23/2020 06/22/2020 01/07/2018  WBC 4.0 - 10.5 K/uL 10.1 14.8(H) 10.2  Hemoglobin 12.0 - 15.0 g/dL 0.1(I) 10.9(L) 10.2(L)  Hematocrit 36.0 - 46.0 % 27.7(L) 33.0(L) 30.9(L)  Platelets 150 - 400 K/uL 227 261 230   Gen: A&O x 3 Abd: soft, NT MSK: left CVA tenderness Ext: no c/c/e; SCDs on  HD2 25yo G3P1011 at [redacted]w[redacted]d with symptomatic nephrolithiasis -Appreciate urology consulting and managing stones.  I was present for the consult and discussed with patient in regards to safety of plan obstetrically.   -Tamsulosin 0.4 mg daily ordered -Awaiting IR recommendation for left nephrostomy tube placement -Strain urine -Continue IVF, Rocephin and pain management -NST q shift -SCDs -U cx pending  Mitchel Honour, DO

## 2020-06-23 NOTE — Procedures (Signed)
Interventional Radiology Procedure Note  Procedure: Placement of a 40F LEFT sided percutaneous nephrostomy tube.   Complications: None  Estimated Blood Loss: None  Recommendations: - Tube to bag drainage - Return to IR in 8 weeks for exchange   Signed,  Sterling Big, MD

## 2020-06-24 LAB — CBC WITH DIFFERENTIAL/PLATELET
Abs Immature Granulocytes: 0.09 10*3/uL — ABNORMAL HIGH (ref 0.00–0.07)
Basophils Absolute: 0 10*3/uL (ref 0.0–0.1)
Basophils Relative: 0 %
Eosinophils Absolute: 0.2 10*3/uL (ref 0.0–0.5)
Eosinophils Relative: 3 %
HCT: 28.6 % — ABNORMAL LOW (ref 36.0–46.0)
Hemoglobin: 9.4 g/dL — ABNORMAL LOW (ref 12.0–15.0)
Immature Granulocytes: 1 %
Lymphocytes Relative: 12 %
Lymphs Abs: 1.1 10*3/uL (ref 0.7–4.0)
MCH: 29.7 pg (ref 26.0–34.0)
MCHC: 32.9 g/dL (ref 30.0–36.0)
MCV: 90.2 fL (ref 80.0–100.0)
Monocytes Absolute: 0.4 10*3/uL (ref 0.1–1.0)
Monocytes Relative: 5 %
Neutro Abs: 7.3 10*3/uL (ref 1.7–7.7)
Neutrophils Relative %: 79 %
Platelets: 256 10*3/uL (ref 150–400)
RBC: 3.17 MIL/uL — ABNORMAL LOW (ref 3.87–5.11)
RDW: 14 % (ref 11.5–15.5)
WBC: 9.1 10*3/uL (ref 4.0–10.5)
nRBC: 0 % (ref 0.0–0.2)

## 2020-06-24 LAB — COMPREHENSIVE METABOLIC PANEL
ALT: 14 U/L (ref 0–44)
AST: 14 U/L — ABNORMAL LOW (ref 15–41)
Albumin: 2.4 g/dL — ABNORMAL LOW (ref 3.5–5.0)
Alkaline Phosphatase: 54 U/L (ref 38–126)
Anion gap: 5 (ref 5–15)
BUN: <5 mg/dL — ABNORMAL LOW (ref 6–20)
CO2: 25 mmol/L (ref 22–32)
Calcium: 8.3 mg/dL — ABNORMAL LOW (ref 8.9–10.3)
Chloride: 106 mmol/L (ref 98–111)
Creatinine, Ser: 0.75 mg/dL (ref 0.44–1.00)
GFR, Estimated: >60 mL/min (ref >60)
Glucose, Bld: 116 mg/dL — ABNORMAL HIGH (ref 70–99)
Potassium: 3.6 mmol/L (ref 3.5–5.1)
Sodium: 136 mmol/L (ref 135–145)
Total Bilirubin: 0.4 mg/dL (ref 0.3–1.2)
Total Protein: 5.5 g/dL — ABNORMAL LOW (ref 6.5–8.1)

## 2020-06-24 MED ORDER — ONDANSETRON HCL 4 MG PO TABS: 4.0000 mg | ORAL_TABLET | Freq: Three times a day (TID) | ORAL | 0 refills | Status: DC | PRN

## 2020-06-24 MED ORDER — TAMSULOSIN HCL 0.4 MG PO CAPS: 0.4000 mg | ORAL_CAPSULE | Freq: Every day | ORAL | 6 refills | Status: DC

## 2020-06-24 NOTE — Discharge Summary (Addendum)
Physician Discharge Summary  Patient ID: Robin Walker MRN: [id number] DOB/AGE: September 16, 1995 25 y.o.  Admit date: 06/22/2020 Discharge date: 06/24/2020  Admission Diagnoses:  Left flank pain  Discharge Diagnoses:  Active Problems:   Nephrolithiasis   Discharged Condition: good  Hospital Course: Patient was admitted on 5/20 with complaint of severe left flank pain and suspicion of nephrolithiasis.  U/S showed bilateral hydronephrosis but no stones.  CT showed a 5 x 6 mm left proximal ureteral stone just distal to the left ureteropelvic junction.  There is also a 4 mm right distal ureteral calculus several centimeters proximal to the right UVJ.  There are multiple intrarenal stones noted which are nonobstructing.  IVF, IV dilaudid and IV Rocephin was initiated.  Urology consult was performed and on HD2, recommendation to proceed with left sided percutaneous nephrostomy tube placement.  She underwent the planned procedure without complication.  On HD3, patient's left flank pain is improved.  She is tolerating nephrostomy tube well.  Creatinine improved from 1.03->0.75.  Prior to discharge, interventional radiology provided dressing change education while her husband was present.  Plan is to replace nephrostomy tube after 6-8 weeks as well as continue tamsulosin, aggressive hydration.  She will monitor for signs of infection and report these symptoms promptly as she has remaining stones also on the right side.  Urine cx returned negative and IV antibiotics were discontinued on HD 3.  Patient to f/u in office on Monday or Tuesday for recheck and to have 3h GTT.    Consults: urology and Interventional Radiology  Significant Diagnostic Studies: microbiology: urine culture: negative and radiology: CT scan: see above findings  Treatments: IV hydration, antibiotics: ceftriaxone, analgesia: Dilaudid and procedures: nephrostomy tube placement  Discharge Exam: Blood pressure (!) 122/50, pulse 94,  temperature 98.2 F (36.8 C), temperature source Oral, resp. rate 20, height 5\' 3"  (1.6 m), weight 125.9 kg, SpO2 95 %, unknown if currently breastfeeding. General appearance: alert, cooperative and appears stated age GI: soft, non-tender; bowel sounds normal; no masses,  no organomegaly Extremities: extremities normal, atraumatic, no cyanosis or edema Incision/Wound:left nephrostomy dressing c/d  Disposition: Discharge disposition: 01-Home or Self Care        Allergies as of 06/24/2020   No Known Allergies     Medication List    STOP taking these medications   docusate sodium 100 MG capsule Commonly known as: Colace   ibuprofen 600 MG tablet Commonly known as: ADVIL   oxyCODONE 5 MG immediate release tablet Commonly known as: Oxy IR/ROXICODONE     TAKE these medications   ferrous sulfate 325 (65 FE) MG EC tablet Take 325 mg by mouth every evening.   levothyroxine 125 MCG tablet Commonly known as: SYNTHROID Take 125 mcg by mouth daily before breakfast.   ondansetron 4 MG tablet Commonly known as: Zofran Take 1 tablet (4 mg total) by mouth every 8 (eight) hours as needed for nausea or vomiting.   PNV-DHA PO Take 1 each by mouth daily.   tamsulosin 0.4 MG Caps capsule Commonly known as: FLOMAX Take 1 capsule (0.4 mg total) by mouth daily.   Vitamin D 50 MCG (2000 UT) tablet Take 2,000 Units by mouth daily.        Signed: 06/26/2020 06/24/2020, 8:36 AM

## 2020-06-24 NOTE — Discharge Instructions (Signed)
Call MD for T>100.4, severe abdominal pain, intractable nausea and/or vomiting, or respiratory distress.   Return to work on Wednesday, May 25th.  OK to park in handicapped parking space at work.

## 2020-06-24 NOTE — Progress Notes (Signed)
Referring Physician(s): Karoline Caldwell  Supervising Physician: Malachy Moan  Patient Status:  [facility] - In-pt  Chief Complaint: Follow up left PCN placement 5/21 in IR  Subjective:  Patient laying in bed on fetal monitor, surprised to see me as she was told no one was coming to show her how to change the dressing so she told her husband he didn't need to come up here yet. We discussed drain care/dressing changes and patient took video of dressing change to show her husband since he will be caring for the drain at home.  She does not have any pain currently, her urine in the bag looks a little orange but she has not urinated any blood that she has noticed. She is asking if she will need to come back for an exchange after the baby is born.   Allergies: Patient has no known allergies.  Medications: Prior to Admission medications   Medication Sig Start Date End Date Taking? Authorizing Provider  Cholecalciferol (VITAMIN D) 50 MCG (2000 UT) tablet Take 2,000 Units by mouth daily.   Yes [provider]  levothyroxine (SYNTHROID, LEVOTHROID) 125 MCG tablet Take 125 mcg by mouth daily before breakfast.   Yes [provider]  ondansetron (ZOFRAN) 4 MG tablet Take 1 tablet (4 mg total) by mouth every 8 (eight) hours as needed for nausea or vomiting. 06/24/20  Yes Morris, Megan, DO  Prenat w/o A-FE-Methfol-FA-DHA (PNV-DHA PO) Take 1 each by mouth daily.   Yes [provider]  docusate sodium (COLACE) 100 MG capsule Take 1 capsule (100 mg total) by mouth 2 (two) times daily. 01/09/18   Ranae Pila, MD  ferrous sulfate 325 (65 FE) MG EC tablet Take 325 mg by mouth every evening.    [provider]  ibuprofen (ADVIL,MOTRIN) 600 MG tablet Take 1 tablet (600 mg total) by mouth every 6 (six) hours as needed. 01/09/18   Ranae Pila, MD  oxyCODONE (OXY IR/ROXICODONE) 5 MG immediate release tablet Take 1 tablet (5 mg total) by mouth every 4 (four)  hours as needed for severe pain. 01/09/18   Ranae Pila, MD  tamsulosin (FLOMAX) 0.4 MG CAPS capsule Take 1 capsule (0.4 mg total) by mouth daily. 06/24/20   Morris, Aundra Millet, DO     Vital Signs: BP (!) 122/50 (BP Location: Right Arm)   Pulse 94   Temp 98.2 F (36.8 C) (Oral)   Resp 20   Ht 5\' 3"  (1.6 m)   Wt 277 lb 9.6 oz (125.9 kg)   SpO2 95%   BMI 49.17 kg/m   Physical Exam Vitals and nursing note reviewed.  Constitutional:      General: She is not in acute distress. HENT:     Head: Normocephalic.  Cardiovascular:     Rate and Rhythm: Normal rate.  Pulmonary:     Effort: Pulmonary effort is normal.  Abdominal:     Palpations: Abdomen is soft.  Genitourinary:    Comments: (+) left PCN to foley gravity bag draining clear blood tinged urine. Insertion site clean/dry/dressed - dressing was removed and new dressing placed to show patient and RN how to care for PCN. Suture and stat lock in tact. No active bleeding or drainage. Non tender. Skin:    General: Skin is warm and dry.  Neurological:     Mental Status: She is alert. Mental status is at baseline.     Imaging: RENAL  Result Date: 06/22/2020 CLINICAL DATA:  History of  kidney stones, back pain since Wednesday EXAM: RENAL / URINARY TRACT ULTRASOUND COMPLETE COMPARISON:  None. FINDINGS: Right Kidney: Renal measurements: 14.3 x 6.9 x 7.0 cm = volume: 362.3 mL. There is moderate hydronephrosis of the right kidney. Left Kidney: Renal measurements: 15.8 x 8.4 x 8.5 cm = volume: 586.3 mL. There is moderate hydronephrosis of the left kidney. Bladder: Mildly distended with limited images and poor visualization. Other: None. IMPRESSION: Moderate bilateral hydronephrosis. No nephrolithiasis visualized sonographically. Electronically Signed   By: Caprice Renshaw   On: 06/22/2020 16:11   CT RENAL STONE STUDY  Result Date: 06/22/2020 CLINICAL DATA:  Flank pain with hematuria [redacted] weeks pregnant EXAM: CT ABDOMEN AND PELVIS WITHOUT  CONTRAST TECHNIQUE: Multidetector CT imaging of the abdomen and pelvis was performed following the standard protocol without IV contrast. Informed consent obtained. COMPARISON:  Ultrasound 06/22/2020 FINDINGS: Lower chest: Lung bases demonstrate no acute consolidation. No pleural effusion. Normal cardiac size. Hepatobiliary: No focal liver abnormality is seen. Status post cholecystectomy. No biliary dilatation. Pancreas: Unremarkable. No pancreatic ductal dilatation or surrounding inflammatory changes. Spleen: Normal in size without focal abnormality. Adrenals/Urinary Tract: Adrenal glands are normal. Multiple bilateral intrarenal calculi, measuring up to 2 mm lower pole right kidney and 3 mm lower pole left kidney. Moderate bilateral hydronephrosis. 5 x 6 mm proximal ureteral stone on the left, just distal to the UPJ. Mild right hydroureter, secondary to a 4 mm stone in the distal right ureter several cm proximal to the right UVJ. Bladder unremarkable Stomach/Bowel: Stomach is within normal limits. Appendix appears normal. No evidence of bowel wall thickening, distention, or inflammatory changes. Vascular/Lymphatic: No significant vascular findings are present. No enlarged abdominal or pelvic lymph nodes. Reproductive: Gravid uterus with single IUP.  No adnexal masses. Other: Negative for free air or free fluid Musculoskeletal: No acute or significant osseous findings. IMPRESSION: 1. Moderate bilateral hydronephrosis. There is a 5 x 6 mm left proximal ureteral stone at or just distal to the left UPJ. Mild right hydroureter with 4 mm distal right ureteral stone, several cm proximal to right UVJ. 2. Multiple intrarenal stones. Electronically Signed   By: Jasmine Pang M.D.   On: 06/22/2020 18:12   IR NEPHROSTOMY PLACEMENT LEFT  Result Date: 06/23/2020 INDICATION: 25 year old female who is currently [redacted] weeks pregnant with bilateral ureteral stones. Left-sided stone is approximately 6 mm and is associated with left  flank pain and developing leukocytosis. She presents for placement of a left-sided percutaneous nephrostomy tube. EXAM: IR NEPHROSTOMY PLACEMENT LEFT COMPARISON:  None. MEDICATIONS: In-patient currently receiving Rocephin every 24 hours. ANESTHESIA/SEDATION: Fentanyl 100 mcg mcg IV The patient was continuously monitored during the procedure by the interventional radiology nurse under my direct supervision. CONTRAST:  14mL OMNIPAQUE IOHEXOL 300 MG/ML SOLN - administered into the collecting system(s) FLUOROSCOPY TIME:  Fluoroscopy Time: 1 minutes 24 seconds (45 mGy). COMPLICATIONS: None immediate. TECHNIQUE: The procedure, risks, benefits, and alternatives were explained to the patient. Questions regarding the procedure were encouraged and answered. The patient understands and consents to the procedure. The left flank was prepped with chlorhexidine in a sterile fashion, and a sterile drape was applied covering the operative field. A sterile gown and sterile gloves were used for the procedure. Local anesthesia was provided with 1% Lidocaine. The left flank was interrogated with ultrasound and the left kidney identified. The kidney is hydronephrotic. A suitable access site on the skin overlying the lower pole, posterior calix was identified. After local mg anesthesia was achieved, a small skin nick was  made with an 11 blade scalpel. A 21 gauge Accustick needle was then advanced under direct sonographic guidance into the lower pole of the left kidney. A 0.018 inch wire was advanced under fluoroscopic guidance into the left renal collecting system. The Accustick sheath was then advanced over the wire and a 0.018 system exchanged for a 0.035 system. Gentle hand injection of contrast material confirms placement of the sheath within the renal collecting system. There is moderate hydronephrosis. The tract from the scan into the renal collecting system was then dilated serially to 10-French. A 10-French Cook all-purpose drain  was then placed and positioned under fluoroscopic guidance. The locking loop is well formed within the left renal pelvis. The catheter was secured to the skin with 2-0 Prolene and a sterile bandage was placed. Catheter was left to gravity bag drainage. IMPRESSION: Successful placement of a left 10 French percutaneous nephrostomy tube. Electronically Signed   By: Malachy Moan M.D.   On: 06/23/2020 15:00    Labs:  CBC: Recent Labs    06/22/20 1506 06/23/20 0521 06/24/20 0515  WBC 14.8* 10.1 9.1  HGB 10.9* 9.3* 9.4*  HCT 33.0* 27.7* 28.6*  PLT 261 227 256    COAGS: No results for input(s): INR, APTT in the last 8760 hours.  BMP: Recent Labs    06/22/20 1506 06/24/20 0515  NA 136 136  K 3.7 3.6  CL 104 106  CO2 22 25  GLUCOSE 90 116*  BUN 5* <5*  CALCIUM 8.6* 8.3*  CREATININE 1.03* 0.75  GFRNONAA >60 >60    LIVER FUNCTION TESTS: Recent Labs    06/22/20 1506 06/24/20 0515  BILITOT 0.3 0.4  AST 12* 14*  ALT 11 14  ALKPHOS 62 54  PROT 6.4* 5.5*  ALBUMIN 3.0* 2.4*    Assessment and Plan:  25 y/o F s/p left PCN for hydronephrosis placed yesterday 5/21 in IR seen today for follow up/drain care education.  PCN appears to be functioning appropriately, clear slightly blood tinged urine in collection bag which is normal, insertion site is unremarkable. We reviewed drain care and dressing changes with patient taking a video of the dressing change to show her husband. We also reviewed that she will come back for an exchange in about 8 weeks and what that would entail - IR scheduler will call her to arrange an appointment time for this.   Patient for d/c today -- drain care instructions placed in AVS and are as follows:  - Dressing changes every 2-3 days or earlier if soiled/wet - Keep insertion site clean/dry/dressed, may shower with insertion site covered by a water tight dressing. Do not submerge the drain (no baths, hot tubs, swimming, etc) - Drain does not need to  be flushed - Routine PCN exchange in about 8 weeks, call [phone] with any questions or concerns - Will defer timing of tube removal to urology  Please call IR with questions or concerns.  Electronically Signed: Villa Herb, PA-C 06/24/2020, 10:19 AM   I spent a total of 25 Minutes at the the patient's bedside AND on the patient's hospital floor or unit, greater than 50% of which was counseling/coordinating care for left PCN follow up.

## 2020-06-24 NOTE — Progress Notes (Signed)
Subjective: Afebrile, vital signs stable.  Patient is feeling better no further nausea.  Creatinine improved to 0.75.  Urine is clear in the nephrostomy drainage bag today.  Objective: Vital signs in last 24 hours: Temp:  [98 F (36.7 C)-98.5 F (36.9 C)] 98.2 F (36.8 C) (05/22 0317) Pulse Rate:  [98-121] 104 (05/22 0317) Resp:  [17-30] 20 (05/22 0317) BP: (103-139)/(52-93) 117/67 (05/22 0317) SpO2:  [96 %-100 %] 98 % (05/22 0317)  Intake/Output from previous day: 05/21 0701 - 05/22 0700 In: 4145.9 [P.O.:1140; I.V.:2851.9; IV Piggyback:154] Out: 3300 [Urine:3300] Intake/Output this shift: No intake/output data recorded.  Physical Exam:  General: Alert and oriented CV: RRR Lungs: Clear Abdomen: Soft, ND Lab Results: Recent Labs    06/22/20 1506 06/23/20 0521 06/24/20 0515  HGB 10.9* 9.3* 9.4*  HCT 33.0* 27.7* 28.6*   BMET Recent Labs    06/22/20 1506 06/24/20 0515  NA 136 136  K 3.7 3.6  CL 104 106  CO2 22 25  GLUCOSE 90 116*  BUN 5* <5*  CREATININE 1.03* 0.75  CALCIUM 8.6* 8.3*     Studies/Results: US RENAL  Result Date: 06/22/2020 CLINICAL DATA:  History of kidney stones, back pain since Wednesday EXAM: RENAL / URINARY TRACT ULTRASOUND COMPLETE COMPARISON:  None. FINDINGS: Right Kidney: Renal measurements: 14.3 x 6.9 x 7.0 cm = volume: 362.3 mL. There is moderate hydronephrosis of the right kidney. Left Kidney: Renal measurements: 15.8 x 8.4 x 8.5 cm = volume: 586.3 mL. There is moderate hydronephrosis of the left kidney. Bladder: Mildly distended with limited images and poor visualization. Other: None. IMPRESSION: Moderate bilateral hydronephrosis. No nephrolithiasis visualized sonographically. Electronically Signed   By: Caprice Renshaw   On: 06/22/2020 16:11   CT RENAL STONE STUDY  Result Date: 06/22/2020 CLINICAL DATA:  Flank pain with hematuria [redacted] weeks pregnant EXAM: CT ABDOMEN AND PELVIS WITHOUT CONTRAST TECHNIQUE: Multidetector CT imaging of the  abdomen and pelvis was performed following the standard protocol without IV contrast. Informed consent obtained. COMPARISON:  Ultrasound 06/22/2020 FINDINGS: Lower chest: Lung bases demonstrate no acute consolidation. No pleural effusion. Normal cardiac size. Hepatobiliary: No focal liver abnormality is seen. Status post cholecystectomy. No biliary dilatation. Pancreas: Unremarkable. No pancreatic ductal dilatation or surrounding inflammatory changes. Spleen: Normal in size without focal abnormality. Adrenals/Urinary Tract: Adrenal glands are normal. Multiple bilateral intrarenal calculi, measuring up to 2 mm lower pole right kidney and 3 mm lower pole left kidney. Moderate bilateral hydronephrosis. 5 x 6 mm proximal ureteral stone on the left, just distal to the UPJ. Mild right hydroureter, secondary to a 4 mm stone in the distal right ureter several cm proximal to the right UVJ. Bladder unremarkable Stomach/Bowel: Stomach is within normal limits. Appendix appears normal. No evidence of bowel wall thickening, distention, or inflammatory changes. Vascular/Lymphatic: No significant vascular findings are present. No enlarged abdominal or pelvic lymph nodes. Reproductive: Gravid uterus with single IUP.  No adnexal masses. Other: Negative for free air or free fluid Musculoskeletal: No acute or significant osseous findings. IMPRESSION: 1. Moderate bilateral hydronephrosis. There is a 5 x 6 mm left proximal ureteral stone at or just distal to the left UPJ. Mild right hydroureter with 4 mm distal right ureteral stone, several cm proximal to right UVJ. 2. Multiple intrarenal stones. Electronically Signed   By: Jasmine Pang M.D.   On: 06/22/2020 18:12   IR NEPHROSTOMY PLACEMENT LEFT  Result Date: 06/23/2020 INDICATION: 25 year old female who is currently [redacted] weeks pregnant with bilateral ureteral stones. Left-sided  stone is approximately 6 mm and is associated with left flank pain and developing leukocytosis. She  presents for placement of a left-sided percutaneous nephrostomy tube. EXAM: IR NEPHROSTOMY PLACEMENT LEFT COMPARISON:  None. MEDICATIONS: In-patient currently receiving Rocephin every 24 hours. ANESTHESIA/SEDATION: Fentanyl 100 mcg mcg IV The patient was continuously monitored during the procedure by the interventional radiology nurse under my direct supervision. CONTRAST:  80mL OMNIPAQUE IOHEXOL 300 MG/ML SOLN - administered into the collecting system(s) FLUOROSCOPY TIME:  Fluoroscopy Time: 1 minutes 24 seconds (45 mGy). COMPLICATIONS: None immediate. TECHNIQUE: The procedure, risks, benefits, and alternatives were explained to the patient. Questions regarding the procedure were encouraged and answered. The patient understands and consents to the procedure. The left flank was prepped with chlorhexidine in a sterile fashion, and a sterile drape was applied covering the operative field. A sterile gown and sterile gloves were used for the procedure. Local anesthesia was provided with 1% Lidocaine. The left flank was interrogated with ultrasound and the left kidney identified. The kidney is hydronephrotic. A suitable access site on the skin overlying the lower pole, posterior calix was identified. After local mg anesthesia was achieved, a small skin nick was made with an 11 blade scalpel. A 21 gauge Accustick needle was then advanced under direct sonographic guidance into the lower pole of the left kidney. A 0.018 inch wire was advanced under fluoroscopic guidance into the left renal collecting system. The Accustick sheath was then advanced over the wire and a 0.018 system exchanged for a 0.035 system. Gentle hand injection of contrast material confirms placement of the sheath within the renal collecting system. There is moderate hydronephrosis. The tract from the scan into the renal collecting system was then dilated serially to 10-French. A 10-French Cook all-purpose drain was then placed and positioned under  fluoroscopic guidance. The locking loop is well formed within the left renal pelvis. The catheter was secured to the skin with 2-0 Prolene and a sterile bandage was placed. Catheter was left to gravity bag drainage. IMPRESSION: Successful placement of a left 10 French percutaneous nephrostomy tube. Electronically Signed   By: Malachy Moan M.D.   On: 06/23/2020 15:00    Assessment/Plan: Ureteral calculi status post left percutaneous nephrostomy tube on 06/23/2020, patient is now pain-free from a stone standpoint has some discomfort around the nephrostomy tube as expected.  No right-sided pain Plan/recommendation: Would continue on tamsulosin 0.4 mg and medical expulsive therapy.  She needs to let us know if she is developed right-sided pain or fever.  Continue to strain urine in hopes that will expel the right distal stone.  Patient will need nephrostomy tube change in approximately 6 to 8 weeks so will need to be done prior to delivery in interventional radiology.  Will reassess stone after baby is delivered in hopes that we can perhaps do in situ ESL if stone is visualized on plain film x-ray.  Okay to discharge home if patient comfortable taking care of nephrostomy tube bandage etc.    LOS: 1 day   Belva Agee 06/24/2020, 7:14 AM

## 2020-06-24 NOTE — Plan of Care (Signed)

## 2020-07-29 ENCOUNTER — Telehealth: Payer: Self-pay | Admitting: Interventional Radiology

## 2020-07-29 NOTE — Telephone Encounter (Signed)
Patient called our VIR answering service.     Return call to [phone] 7:50pm 07/29/20.   Patient has history of percutaneous nephrostomy 06/22/20 as inpatient.  History of nephrolithiasis.    Patient reports fever at home of 101.3  She also says "my heart is racing"  She remains pregnant ~32 weeks.    She reports the PCN is draining mostly clear urine, but with some pink blood tinged urine yesterday.   Given her history and pregnancy, I encouraged her to seek help [facility] the [facility], as there are multiple possible etiology for her fever.   She understands.    Signed,  Yvone Neu. Loreta Ave, DO

## 2020-07-30 ENCOUNTER — Inpatient Hospital Stay (HOSPITAL_COMMUNITY): Payer: BC Managed Care – PPO

## 2020-07-30 ENCOUNTER — Other Ambulatory Visit: Payer: Self-pay

## 2020-07-30 ENCOUNTER — Encounter (HOSPITAL_COMMUNITY): Payer: Self-pay | Admitting: Obstetrics and Gynecology

## 2020-07-30 ENCOUNTER — Inpatient Hospital Stay (HOSPITAL_COMMUNITY)
Admission: AD | Admit: 2020-07-30 | Discharge: 2020-08-03 | DRG: 831 | Disposition: A | Discharge status: Home / Self Care | Payer: BC Managed Care – PPO | Attending: Obstetrics and Gynecology | Admitting: Obstetrics and Gynecology

## 2020-07-30 DIAGNOSIS — B952 Enterococcus as the cause of diseases classified elsewhere: Secondary | ICD-10-CM | POA: Diagnosis present

## 2020-07-30 DIAGNOSIS — O2343 Unspecified infection of urinary tract in pregnancy, third trimester: Secondary | ICD-10-CM

## 2020-07-30 DIAGNOSIS — E039 Hypothyroidism, unspecified: Secondary | ICD-10-CM | POA: Diagnosis present

## 2020-07-30 DIAGNOSIS — O34219 Maternal care for unspecified type scar from previous cesarean delivery: Secondary | ICD-10-CM

## 2020-07-30 DIAGNOSIS — O99891 Other specified diseases and conditions complicating pregnancy: Secondary | ICD-10-CM | POA: Diagnosis not present

## 2020-07-30 DIAGNOSIS — N202 Calculus of kidney with calculus of ureter: Secondary | ICD-10-CM | POA: Diagnosis present

## 2020-07-30 DIAGNOSIS — A419 Sepsis, unspecified organism: Secondary | ICD-10-CM | POA: Diagnosis present

## 2020-07-30 DIAGNOSIS — E669 Obesity, unspecified: Secondary | ICD-10-CM | POA: Diagnosis not present

## 2020-07-30 DIAGNOSIS — O99283 Endocrine, nutritional and metabolic diseases complicating pregnancy, third trimester: Secondary | ICD-10-CM | POA: Diagnosis present

## 2020-07-30 DIAGNOSIS — E860 Dehydration: Secondary | ICD-10-CM | POA: Diagnosis present

## 2020-07-30 DIAGNOSIS — R6520 Severe sepsis without septic shock: Secondary | ICD-10-CM | POA: Diagnosis present

## 2020-07-30 DIAGNOSIS — O2303 Infections of kidney in pregnancy, third trimester: Secondary | ICD-10-CM | POA: Diagnosis present

## 2020-07-30 DIAGNOSIS — N390 Urinary tract infection, site not specified: Secondary | ICD-10-CM

## 2020-07-30 DIAGNOSIS — O98813 Other maternal infectious and parasitic diseases complicating pregnancy, third trimester: Secondary | ICD-10-CM | POA: Diagnosis present

## 2020-07-30 DIAGNOSIS — O26833 Pregnancy related renal disease, third trimester: Secondary | ICD-10-CM

## 2020-07-30 DIAGNOSIS — Z936 Other artificial openings of urinary tract status: Secondary | ICD-10-CM | POA: Diagnosis not present

## 2020-07-30 DIAGNOSIS — Z3A33 33 weeks gestation of pregnancy: Secondary | ICD-10-CM | POA: Diagnosis not present

## 2020-07-30 DIAGNOSIS — O99213 Obesity complicating pregnancy, third trimester: Secondary | ICD-10-CM | POA: Diagnosis not present

## 2020-07-30 DIAGNOSIS — Z20822 Contact with and (suspected) exposure to covid-19: Secondary | ICD-10-CM | POA: Diagnosis present

## 2020-07-30 DIAGNOSIS — N200 Calculus of kidney: Secondary | ICD-10-CM

## 2020-07-30 LAB — URINALYSIS, ROUTINE W REFLEX MICROSCOPIC
Bilirubin Urine: NEGATIVE
Glucose, UA: NEGATIVE mg/dL
Ketones, ur: 5 mg/dL — AB
Nitrite: POSITIVE — AB
Protein, ur: 100 mg/dL — AB
Specific Gravity, Urine: 1.009 (ref 1.005–1.030)
pH: 7 (ref 5.0–8.0)

## 2020-07-30 LAB — COMPREHENSIVE METABOLIC PANEL
ALT: 11 U/L (ref 0–44)
AST: 18 U/L (ref 15–41)
Albumin: 2.7 g/dL — ABNORMAL LOW (ref 3.5–5.0)
Alkaline Phosphatase: 81 U/L (ref 38–126)
Anion gap: 12 (ref 5–15)
BUN: <5 mg/dL — ABNORMAL LOW (ref 6–20)
CO2: 18 mmol/L — ABNORMAL LOW (ref 22–32)
Calcium: 8.6 mg/dL — ABNORMAL LOW (ref 8.9–10.3)
Chloride: 107 mmol/L (ref 98–111)
Creatinine, Ser: 0.79 mg/dL (ref 0.44–1.00)
GFR, Estimated: >60 mL/min (ref >60)
Glucose, Bld: 145 mg/dL — ABNORMAL HIGH (ref 70–99)
Potassium: 3.9 mmol/L (ref 3.5–5.1)
Sodium: 137 mmol/L (ref 135–145)
Total Bilirubin: 0.8 mg/dL (ref 0.3–1.2)
Total Protein: 6.4 g/dL — ABNORMAL LOW (ref 6.5–8.1)

## 2020-07-30 LAB — CBC WITH DIFFERENTIAL/PLATELET
Abs Immature Granulocytes: 0.41 10*3/uL — ABNORMAL HIGH (ref 0.00–0.07)
Basophils Absolute: 0.1 10*3/uL (ref 0.0–0.1)
Basophils Relative: 0 %
Eosinophils Absolute: 0.1 10*3/uL (ref 0.0–0.5)
Eosinophils Relative: 0 %
HCT: 32.6 % — ABNORMAL LOW (ref 36.0–46.0)
Hemoglobin: 10.6 g/dL — ABNORMAL LOW (ref 12.0–15.0)
Immature Granulocytes: 2 %
Lymphocytes Relative: 2 %
Lymphs Abs: 0.5 10*3/uL — ABNORMAL LOW (ref 0.7–4.0)
MCH: 28.4 pg (ref 26.0–34.0)
MCHC: 32.5 g/dL (ref 30.0–36.0)
MCV: 87.4 fL (ref 80.0–100.0)
Monocytes Absolute: 1.6 10*3/uL — ABNORMAL HIGH (ref 0.1–1.0)
Monocytes Relative: 7 %
Neutro Abs: 21.2 10*3/uL — ABNORMAL HIGH (ref 1.7–7.7)
Neutrophils Relative %: 89 %
Platelets: 262 10*3/uL (ref 150–400)
RBC: 3.73 MIL/uL — ABNORMAL LOW (ref 3.87–5.11)
RDW: 14 % (ref 11.5–15.5)
WBC: 23.8 10*3/uL — ABNORMAL HIGH (ref 4.0–10.5)
nRBC: 0.1 % (ref 0.0–0.2)

## 2020-07-30 LAB — BASIC METABOLIC PANEL
Anion gap: 9 (ref 5–15)
BUN: <5 mg/dL — ABNORMAL LOW (ref 6–20)
CO2: 16 mmol/L — ABNORMAL LOW (ref 22–32)
Calcium: 8.1 mg/dL — ABNORMAL LOW (ref 8.9–10.3)
Chloride: 111 mmol/L (ref 98–111)
Creatinine, Ser: 0.8 mg/dL (ref 0.44–1.00)
GFR, Estimated: >60 mL/min (ref >60)
Glucose, Bld: 168 mg/dL — ABNORMAL HIGH (ref 70–99)
Potassium: 3 mmol/L — ABNORMAL LOW (ref 3.5–5.1)
Sodium: 136 mmol/L (ref 135–145)

## 2020-07-30 LAB — TYPE AND SCREEN
ABO/RH(D): O NEG
Antibody Screen: POSITIVE

## 2020-07-30 LAB — LACTIC ACID, PLASMA
Lactic Acid, Venous: 2.1 mmol/L (ref 0.5–1.9)
Lactic Acid, Venous: 1 mmol/L (ref 0.5–1.9)
Lactic Acid, Venous: 2.5 mmol/L (ref 0.5–1.9)

## 2020-07-30 LAB — RESP PANEL BY RT-PCR (FLU A&B, COVID) ARPGX2
Influenza A by PCR: NEGATIVE
Influenza B by PCR: NEGATIVE
SARS Coronavirus 2 by RT PCR: NEGATIVE

## 2020-07-30 LAB — PROCALCITONIN: Procalcitonin: 1.09 ng/mL

## 2020-07-30 MED ORDER — SODIUM CHLORIDE 0.9 % IV BOLUS
1000.0000 mL | INTRAVENOUS | Status: AC
  Administered 2020-07-30: 1000 mL via INTRAVENOUS

## 2020-07-30 MED ORDER — SODIUM CHLORIDE 0.9 % IV BOLUS
1000.0000 mL | Freq: Once | INTRAVENOUS | Status: AC
  Administered 2020-07-30: 1000 mL via INTRAVENOUS

## 2020-07-30 MED ORDER — LEVOTHYROXINE SODIUM 125 MCG PO TABS
125.0000 ug | ORAL_TABLET | Freq: Every day | ORAL | Status: DC
  Administered 2020-07-31 – 2020-08-03 (×4): 125 ug via ORAL
  Filled 2020-07-30 (×5): qty 1

## 2020-07-30 MED ORDER — HYDROMORPHONE HCL 1 MG/ML IJ SOLN
1.0000 mg | Freq: Once | INTRAMUSCULAR | Status: AC
  Administered 2020-07-30: 1 mg via INTRAVENOUS
  Filled 2020-07-30: qty 1

## 2020-07-30 MED ORDER — ZOLPIDEM TARTRATE 5 MG PO TABS
5.0000 mg | ORAL_TABLET | Freq: Every evening | ORAL | Status: DC | PRN
  Administered 2020-08-01 (×2): 5 mg via ORAL
  Filled 2020-07-30 (×2): qty 1

## 2020-07-30 MED ORDER — SODIUM CHLORIDE 0.9 % IV SOLN
2.0000 g | Freq: Once | INTRAVENOUS | Status: AC
  Administered 2020-07-30: 2 g via INTRAVENOUS
  Filled 2020-07-30: qty 2

## 2020-07-30 MED ORDER — VANCOMYCIN HCL 10 G IV SOLR
2500.0000 mg | Freq: Once | INTRAVENOUS | Status: AC
  Administered 2020-07-31: 2500 mg via INTRAVENOUS
  Filled 2020-07-30: qty 2500

## 2020-07-30 MED ORDER — PRENATAL MULTIVITAMIN CH
1.0000 | ORAL_TABLET | Freq: Every day | ORAL | Status: DC
  Administered 2020-07-30 – 2020-08-03 (×5): 1 via ORAL
  Filled 2020-07-30 (×7): qty 1

## 2020-07-30 MED ORDER — TAMSULOSIN HCL 0.4 MG PO CAPS
0.4000 mg | ORAL_CAPSULE | Freq: Every day | ORAL | Status: DC
  Filled 2020-07-30 (×5): qty 1

## 2020-07-30 MED ORDER — ONDANSETRON HCL 4 MG/2ML IJ SOLN
4.0000 mg | Freq: Once | INTRAMUSCULAR | Status: AC
  Administered 2020-07-30: 4 mg via INTRAVENOUS
  Filled 2020-07-30: qty 2

## 2020-07-30 MED ORDER — CALCIUM CARBONATE ANTACID 500 MG PO CHEW: 2.0000 | CHEWABLE_TABLET | ORAL | Status: DC | PRN

## 2020-07-30 MED ORDER — LACTATED RINGERS IV BOLUS
1000.0000 mL | Freq: Once | INTRAVENOUS | Status: AC
  Administered 2020-07-30: 1000 mL via INTRAVENOUS

## 2020-07-30 MED ORDER — ACETAMINOPHEN 325 MG PO TABS
650.0000 mg | ORAL_TABLET | ORAL | Status: DC | PRN
  Administered 2020-07-30 – 2020-08-03 (×10): 650 mg via ORAL
  Filled 2020-07-30 (×10): qty 2

## 2020-07-30 MED ORDER — DOCUSATE SODIUM 100 MG PO CAPS
100.0000 mg | ORAL_CAPSULE | Freq: Every day | ORAL | Status: DC
  Administered 2020-07-30 – 2020-08-03 (×4): 100 mg via ORAL
  Filled 2020-07-30 (×4): qty 1

## 2020-07-30 MED ORDER — ACETAMINOPHEN 500 MG PO TABS
1000.0000 mg | ORAL_TABLET | Freq: Once | ORAL | Status: AC
  Administered 2020-07-30: 1000 mg via ORAL
  Filled 2020-07-30: qty 2

## 2020-07-30 MED ORDER — SODIUM CHLORIDE 0.9 % IV SOLN: INTRAVENOUS | Status: DC

## 2020-07-30 MED ORDER — SODIUM CHLORIDE 0.9 % IV SOLN
2.0000 g | Freq: Two times a day (BID) | INTRAVENOUS | Status: DC
  Filled 2020-07-30 (×2): qty 2

## 2020-07-30 MED ORDER — VANCOMYCIN HCL 10 G IV SOLR: 2000.0000 mg | Freq: Once | INTRAVENOUS | Status: DC

## 2020-07-30 MED ORDER — SODIUM CHLORIDE 0.9 % IV SOLN
2.0000 g | INTRAVENOUS | Status: DC
  Filled 2020-07-30: qty 20

## 2020-07-30 NOTE — Progress Notes (Signed)
Patient C/O return of back pain L>R Was up to BR to void, a little dizzy afterward  Today's Vitals   07/30/20 2035 07/30/20 2043 07/30/20 2050 07/30/20 2100  BP: 123/62     Pulse: (!) 146     Resp: 20     Temp: 100 F (37.8 C)  99.3 F (37.4 C)   TempSrc: Oral     SpO2: 100% 99%    Weight:      Height:      PainSc:    8    Body mass index is 48.57 kg/m.   UO dilute  Temp was 100.5 oral>Tylenol about 6:30 pm>100>99.3  Abdomen soft, uterus soft and NT  Lactic acid 2.1>1.0>2.5 Procalcitonin 1.09  FHT 170s  A/P: D/W intensivist-we reviewed chart and status I had just started a NS bolus He recommends a total of NS bolus His team will do bedside consult ASAP Tylenol q4 hours prn fever D/W family

## 2020-07-30 NOTE — Consult Note (Addendum)
Urology Consult   Physician requesting consult: Hulan Fess, MD  Reason for consult: Kidney stones  History of Present Illness: Robin Walker is a 25 y.o. female who is currently at 33 weeks and 4 days gestation with a significant past medical history of bilateral ureteral stones.  She is status post left PCN placement on 06/23/2020 due to an obstructing left UPJ calculus.  She was also noted to have a 4 mm right distal ureteral calculus on CT from 06/22/2020, but no upper tract drainage was deemed necessary at that time.  Per her office report on 07/23/2020, the patient has since passed a small kidney stone (unknown laterality).  Renal ultrasound today shows no evidence of hydronephrosis, bilaterally.  She presented to labor and delivery earlier today with worsening left-sided flank pain associated with fevers greater than 103F at home and general malaise.  UA with features concerning for UTI.  She was found to have leukocytosis and a slightly elevated lactic acid at 2.1.  Her fevers and lactate levels have since improved.     Past Medical History:  Diagnosis Date   History of kidney stones    Hypothyroidism    PONV (postoperative nausea and vomiting)     Past Surgical History:  Procedure Laterality Date   CESAREAN SECTION N/A 01/06/2018   Procedure: CESAREAN SECTION;  Surgeon: Candice Camp, MD;  Location: [facility] BIRTHING SUITES;  Service: Obstetrics;  Laterality: N/A;  Primary edc12/8 NKDA need RNFA   CHOLECYSTECTOMY     IR NEPHROSTOMY PLACEMENT LEFT  06/23/2020    Current Hospital Medications:  Home Meds:  Current Meds  Medication Sig   ciprofloxacin (CIPRO) 500 MG tablet Take 500 mg by mouth 2 (two) times daily.   ergocalciferol (VITAMIN D2) 1.25 MG (50000 UT) capsule ergocalciferol (vitamin D2) 1,250 mcg (50,000 unit) capsule   levothyroxine (SYNTHROID, LEVOTHROID) 125 MCG tablet Take 125 mcg by mouth daily before breakfast.   Prenat w/o A-FE-Methfol-FA-DHA (PNV-DHA PO) Take  1 each by mouth daily.    Scheduled Meds:  docusate sodium  100 mg Oral Daily   [START ON 07/31/2020] levothyroxine  125 mcg Oral QAC breakfast   prenatal multivitamin  1 tablet Oral Q1200   tamsulosin  0.4 mg Oral Daily   Continuous Infusions:  sodium chloride 150 mL/hr at 07/30/20 1628   [START ON 07/31/2020] cefTRIAXone (ROCEPHIN)  IV     PRN Meds:.acetaminophen, calcium carbonate, zolpidem  Allergies: No Known Allergies  Family History  Problem Relation Age of Onset   Diabetes Mother    Heart disease Father    Hypertension Father    Diabetes Father     Social History:  reports that she has never smoked. She has never used smokeless tobacco. She reports previous alcohol use. She reports previous drug use.  ROS: A complete review of systems was performed.  All systems are negative except for pertinent findings as noted.  Physical Exam:  Vital signs in last 24 hours: Temp:  [98.1 F (36.7 C)-102.8 F (39.3 C)] 98.4 F (36.9 C) (06/27 1720) Pulse Rate:  [117-140] 117 (06/27 1529) Resp:  [19-21] 19 (06/27 1529) BP: (114-137)/(46-58) 116/46 (06/27 1529) SpO2:  [96 %-99 %] 98 % (06/27 1529) Weight:  [124.4 kg] 124.4 kg (06/27 1222) Constitutional:  Alert and oriented, No acute distress Cardiovascular: Regular rate and rhythm, No JVD Respiratory: Normal respiratory effort, Lungs clear bilaterally GI: Abdomen is soft, nontender, nondistended, no abdominal masses GU: Left PCN in place and draining clear-yellow urine   Lymphatic:  No lymphadenopathy Neurologic: Grossly intact, no focal deficits Psychiatric: Normal mood and affect  Laboratory Data:  Recent Labs    07/30/20 1228  WBC 23.8*  HGB 10.6*  HCT 32.6*  PLT 262    Recent Labs    07/30/20 1228  NA 137  K 3.9  CL 107  GLUCOSE 145*  BUN <5*  CALCIUM 8.6*  CREATININE 0.79     Results for orders placed or performed during the hospital encounter of 07/30/20 (from the past 24 hour(s))  Urinalysis,  Routine w reflex microscopic OB Catheterized     Status: Abnormal   Collection Time: 07/30/20 12:27 PM  Result Value Ref Range   Color, Urine YELLOW YELLOW   APPearance HAZY (A) CLEAR   Specific Gravity, Urine 1.009 1.005 - 1.030   pH 7.0 5.0 - 8.0   Glucose, UA NEGATIVE NEGATIVE mg/dL   Hgb urine dipstick MODERATE (A) NEGATIVE   Bilirubin Urine NEGATIVE NEGATIVE   Ketones, ur 5 (A) NEGATIVE mg/dL   Protein, ur 790 (A) NEGATIVE mg/dL   Nitrite POSITIVE (A) NEGATIVE   Leukocytes,Ua MODERATE (A) NEGATIVE   RBC / HPF 6-10 0 - 5 RBC/hpf   WBC, UA 6-10 0 - 5 WBC/hpf   Bacteria, UA RARE (A) NONE SEEN   Squamous Epithelial / LPF 0-5 0 - 5   Mucus PRESENT   CBC with Differential/Platelet     Status: Abnormal   Collection Time: 07/30/20 12:28 PM  Result Value Ref Range   WBC 23.8 (H) 4.0 - 10.5 K/uL   RBC 3.73 (L) 3.87 - 5.11 MIL/uL   Hemoglobin 10.6 (L) 12.0 - 15.0 g/dL   HCT 24.0 (L) 97.3 - 53.2 %   MCV 87.4 80.0 - 100.0 fL   MCH 28.4 26.0 - 34.0 pg   MCHC 32.5 30.0 - 36.0 g/dL   RDW 99.2 42.6 - 83.4 %   Platelets 262 150 - 400 K/uL   nRBC 0.1 0.0 - 0.2 %   Neutrophils Relative % 89 %   Neutro Abs 21.2 (H) 1.7 - 7.7 K/uL   Lymphocytes Relative 2 %   Lymphs Abs 0.5 (L) 0.7 - 4.0 K/uL   Monocytes Relative 7 %   Monocytes Absolute 1.6 (H) 0.1 - 1.0 K/uL   Eosinophils Relative 0 %   Eosinophils Absolute 0.1 0.0 - 0.5 K/uL   Basophils Relative 0 %   Basophils Absolute 0.1 0.0 - 0.1 K/uL   Immature Granulocytes 2 %   Abs Immature Granulocytes 0.41 (H) 0.00 - 0.07 K/uL  Comprehensive metabolic panel     Status: Abnormal   Collection Time: 07/30/20 12:28 PM  Result Value Ref Range   Sodium 137 135 - 145 mmol/L   Potassium 3.9 3.5 - 5.1 mmol/L   Chloride 107 98 - 111 mmol/L   CO2 18 (L) 22 - 32 mmol/L   Glucose, Bld 145 (H) 70 - 99 mg/dL   BUN <5 (L) 6 - 20 mg/dL   Creatinine, Ser 1.96 0.44 - 1.00 mg/dL   Calcium 8.6 (L) 8.9 - 10.3 mg/dL   Total Protein 6.4 (L) 6.5 - 8.1 g/dL    Albumin 2.7 (L) 3.5 - 5.0 g/dL   AST 18 15 - 41 U/L   ALT 11 0 - 44 U/L   Alkaline Phosphatase 81 38 - 126 U/L   Total Bilirubin 0.8 0.3 - 1.2 mg/dL   GFR, Estimated >22 >29 mL/min   Anion gap 12 5 - 15  Type and  screen     Status: None   Collection Time: 07/30/20 12:55 PM  Result Value Ref Range   ABO/RH(D) O NEG    Antibody Screen POS    Sample Expiration 08/02/2020,2359    Antibody Identification      PASSIVELY ACQUIRED ANTI-D Performed at [facility], [address] [place], [place] [address]   Lactic acid, plasma     Status: Abnormal   Collection Time: 07/30/20  1:20 PM  Result Value Ref Range   Lactic Acid, Venous 2.1 (HH) 0.5 - 1.9 mmol/L  Resp Panel by RT-PCR (Flu A&B, Covid) Nasopharyngeal Swab     Status: None   Collection Time: 07/30/20  2:54 PM   Specimen: Nasopharyngeal Swab; Nasopharyngeal(NP) swabs in vial transport medium  Result Value Ref Range   SARS Coronavirus 2 by RT PCR NEGATIVE NEGATIVE   Influenza A by PCR NEGATIVE NEGATIVE   Influenza B by PCR NEGATIVE NEGATIVE  Lactic acid, plasma     Status: None   Collection Time: 07/30/20  3:12 PM  Result Value Ref Range   Lactic Acid, Venous 1.0 0.5 - 1.9 mmol/L  Procalcitonin - Baseline     Status: None   Collection Time: 07/30/20  3:12 PM  Result Value Ref Range   Procalcitonin 1.09 ng/mL   Recent Results (from the past 240 hour(s))  Resp Panel by RT-PCR (Flu A&B, Covid) Nasopharyngeal Swab     Status: None   Collection Time: 07/30/20  2:54 PM   Specimen: Nasopharyngeal Swab; Nasopharyngeal(NP) swabs in vial transport medium  Result Value Ref Range Status   SARS Coronavirus 2 by RT PCR NEGATIVE NEGATIVE Final    Comment: (NOTE) SARS-CoV-2 target nucleic acids are NOT DETECTED.  The SARS-CoV-2 RNA is generally detectable in upper respiratory specimens during the acute phase of infection. The lowest concentration of SARS-CoV-2 viral copies this assay can detect is 138 copies/mL. A negative  result does not preclude SARS-Cov-2 infection and should not be used as the sole basis for treatment or other patient management decisions. A negative result may occur with  improper specimen collection/handling, submission of specimen other than nasopharyngeal swab, presence of viral mutation(s) within the areas targeted by this assay, and inadequate number of viral copies(<138 copies/mL). A negative result must be combined with clinical observations, patient history, and epidemiological information. The expected result is Negative.  Fact Sheet for Patients:  BloggerCourse.com  Fact Sheet for Healthcare Providers:  SeriousBroker.it  This test is no t yet approved or cleared by the [place] FDA and  has been authorized for detection and/or diagnosis of SARS-CoV-2 by FDA under an Emergency Use Authorization (EUA). This EUA will remain  in effect (meaning this test can be used) for the duration of the COVID-19 declaration under Section 564(b)(1) of the Act, 21 U.S.C.section 360bbb-3(b)(1), unless the authorization is terminated  or revoked sooner.       Influenza A by PCR NEGATIVE NEGATIVE Final   Influenza B by PCR NEGATIVE NEGATIVE Final    Comment: (NOTE) The Xpert Xpress SARS-CoV-2/FLU/RSV plus assay is intended as an aid in the diagnosis of influenza from Nasopharyngeal swab specimens and should not be used as a sole basis for treatment. Nasal washings and aspirates are unacceptable for Xpert Xpress SARS-CoV-2/FLU/RSV testing.  Fact Sheet for Patients: BloggerCourse.com  Fact Sheet for Healthcare Providers: SeriousBroker.it  This test is not yet approved or cleared by the [place] FDA and has been authorized for detection and/or diagnosis of SARS-CoV-2  by FDA under an Emergency Use Authorization (EUA). This EUA will remain in effect (meaning this test can be used)  for the duration of the COVID-19 declaration under Section 564(b)(1) of the Act, 21 U.S.C. section 360bbb-3(b)(1), unless the authorization is terminated or revoked.  Performed at [facility], [address] [place], [place] [id number]     Renal Function: Recent Labs    07/30/20 1228  CREATININE 0.79   Estimated Creatinine Clearance: 135.4 mL/min (by C-G formula based on SCr of 0.79 mg/dL).  Radiologic Imaging: US RENAL  Result Date: 07/30/2020 CLINICAL DATA:  Left flank pain. EXAM: RENAL / URINARY TRACT ULTRASOUND COMPLETE COMPARISON:  Jun 22, 2020. FINDINGS: Right Kidney: Renal measurements: 13.0 x 5.0 x 6.2 cm = volume: 210 mL. Echogenicity within normal limits. No mass or hydronephrosis visualized. Left Kidney: Renal measurements: 14.5 x 6.1 x 6.3 cm = volume: 291 mL. Echogenicity within normal limits. No mass or hydronephrosis visualized. Bladder: Appears normal for degree of bladder distention. Other: None. IMPRESSION: Normal renal ultrasound. Electronically Signed   By: Lupita RaiderJames  Green Jr M.D.   On: 07/30/2020 14:46    I independently reviewed the above imaging studies.  Impression/Recommendation 25 year old female at 1333 weeks gestation with an obstructing left ureteral calculus, s/p left PCN on 06/22/20 now with suspected left pyelonephritis.   -Blood and urine cultures are pending.  Continue IV abx coverage with rocephin  -Continue IVF resuscitation  -Will continue to closely monitor and consider left PCN exchange should her clinical course worse -Dr. Benancio DeedsNewsome is aware of her admission and will continue to follow.   Rhoderick Moodyhristopher Cabria Micalizzi, MD [facility] 07/30/2020, 5:49 PM

## 2020-07-30 NOTE — H&P (Signed)
Robin Walker is a 25 y.o. female presenting for fever. She had a left percutaneous nephrostomy placed 06/23/20 due to a kidney stone at the [facility]. Saturday night, 2 days ago, she began to have a little flank pain. This became chills and fever yesterday. The urology service called in Cipro and encouraged po hydration. She took Tylenol which initially helped some. She did have some N/V. She took 2 doses of Cipro. Because of climbing fever and chills she came to [facility]. Now she feels somewhat better. Her flank pain is still present but improved. She denies ROM, vaginal bleeding. She is having some painful UCs that have started since arrival at hospital. OB History     Gravida  3   Para  1   Term  1   Preterm      AB  1   Living  1      SAB  1   IAB      Ectopic      Multiple  0   Live Births  1          Past Medical History:  Diagnosis Date   History of kidney stones    Hypothyroidism    PONV (postoperative nausea and vomiting)    Past Surgical History:  Procedure Laterality Date   CESAREAN SECTION N/A 01/06/2018   Procedure: CESAREAN SECTION;  Surgeon: Candice Camp, MD;  Location: [facility] BIRTHING SUITES;  Service: Obstetrics;  Laterality: N/A;  Primary edc12/8 NKDA need RNFA   CHOLECYSTECTOMY     IR NEPHROSTOMY PLACEMENT LEFT  06/23/2020   Family History: family history includes Diabetes in her father and mother; Heart disease in her father; Hypertension in her father. Social History:  reports that she has never smoked. She has never used smokeless tobacco. She reports previous alcohol use. She reports previous drug use.     Maternal Diabetes: No Genetic Screening: Normal Maternal Ultrasounds/Referrals: Normal Fetal Ultrasounds or other Referrals:  None Maternal Substance Abuse:  No Significant Maternal Medications:  Meds include: Other: Flomax Significant Maternal Lab Results:  None Other Comments:  None  Review of Systems  Constitutional:  Positive for  fever.  Maternal Medical History:  Fetal activity: Perceived fetal activity is normal.      Blood pressure (!) 116/46, pulse (!) 117, temperature 98.4 F (36.9 C), temperature source Oral, resp. rate 19, height 5\' 3"  (1.6 m), weight 124.4 kg, SpO2 98 %, unknown if currently breastfeeding. Maternal Exam:  Abdomen: Patient reports no abdominal tenderness.   Fetal Exam Fetal State Assessment: Category I - tracings are normal.  Urine in nephrostomy bag pale yellow  Physical Exam Constitutional:      General: She is not in acute distress. Cardiovascular:     Rate and Rhythm: Regular rhythm.  Pulmonary:     Effort: Pulmonary effort is normal.  Neurological:     Mental Status: She is alert.   Mild left CVAT No right CVAT Abdomen gravid, NT  I observe one UC while in room with patient  Prenatal labs: ABO, Rh: --/--/O NEG (06/27 1255) Antibody: POS (06/27 1255) Rubella:   RPR:    HBsAg:    HIV:    GBS:     Urine and blood cultures have been sent  Results for orders placed or performed during the hospital encounter of 07/30/20 (from the past 24 hour(s))  Urinalysis, Routine w reflex microscopic OB Catheterized     Status: Abnormal   Collection Time: 07/30/20 12:27 PM  Result Value  Ref Range   Color, Urine YELLOW YELLOW   APPearance HAZY (A) CLEAR   Specific Gravity, Urine 1.009 1.005 - 1.030   pH 7.0 5.0 - 8.0   Glucose, UA NEGATIVE NEGATIVE mg/dL   Hgb urine dipstick MODERATE (A) NEGATIVE   Bilirubin Urine NEGATIVE NEGATIVE   Ketones, ur 5 (A) NEGATIVE mg/dL   Protein, ur 229 (A) NEGATIVE mg/dL   Nitrite POSITIVE (A) NEGATIVE   Leukocytes,Ua MODERATE (A) NEGATIVE   RBC / HPF 6-10 0 - 5 RBC/hpf   WBC, UA 6-10 0 - 5 WBC/hpf   Bacteria, UA RARE (A) NONE SEEN   Squamous Epithelial / LPF 0-5 0 - 5   Mucus PRESENT   CBC with Differential/Platelet     Status: Abnormal   Collection Time: 07/30/20 12:28 PM  Result Value Ref Range   WBC 23.8 (H) 4.0 - 10.5 K/uL   RBC  3.73 (L) 3.87 - 5.11 MIL/uL   Hemoglobin 10.6 (L) 12.0 - 15.0 g/dL   HCT 79.8 (L) 92.1 - 19.4 %   MCV 87.4 80.0 - 100.0 fL   MCH 28.4 26.0 - 34.0 pg   MCHC 32.5 30.0 - 36.0 g/dL   RDW 17.4 08.1 - 44.8 %   Platelets 262 150 - 400 K/uL   nRBC 0.1 0.0 - 0.2 %   Neutrophils Relative % 89 %   Neutro Abs 21.2 (H) 1.7 - 7.7 K/uL   Lymphocytes Relative 2 %   Lymphs Abs 0.5 (L) 0.7 - 4.0 K/uL   Monocytes Relative 7 %   Monocytes Absolute 1.6 (H) 0.1 - 1.0 K/uL   Eosinophils Relative 0 %   Eosinophils Absolute 0.1 0.0 - 0.5 K/uL   Basophils Relative 0 %   Basophils Absolute 0.1 0.0 - 0.1 K/uL   Immature Granulocytes 2 %   Abs Immature Granulocytes 0.41 (H) 0.00 - 0.07 K/uL  Comprehensive metabolic panel     Status: Abnormal   Collection Time: 07/30/20 12:28 PM  Result Value Ref Range   Sodium 137 135 - 145 mmol/L   Potassium 3.9 3.5 - 5.1 mmol/L   Chloride 107 98 - 111 mmol/L   CO2 18 (L) 22 - 32 mmol/L   Glucose, Bld 145 (H) 70 - 99 mg/dL   BUN <5 (L) 6 - 20 mg/dL   Creatinine, Ser 1.85 0.44 - 1.00 mg/dL   Calcium 8.6 (L) 8.9 - 10.3 mg/dL   Total Protein 6.4 (L) 6.5 - 8.1 g/dL   Albumin 2.7 (L) 3.5 - 5.0 g/dL   AST 18 15 - 41 U/L   ALT 11 0 - 44 U/L   Alkaline Phosphatase 81 38 - 126 U/L   Total Bilirubin 0.8 0.3 - 1.2 mg/dL   GFR, Estimated >63 >14 mL/min   Anion gap 12 5 - 15  Type and screen     Status: None   Collection Time: 07/30/20 12:55 PM  Result Value Ref Range   ABO/RH(D) O NEG    Antibody Screen POS    Sample Expiration 08/02/2020,2359    Antibody Identification      PASSIVELY ACQUIRED ANTI-D Performed at [facility], [address], [place], [place] [address]   Lactic acid, plasma     Status: Abnormal   Collection Time: 07/30/20  1:20 PM  Result Value Ref Range   Lactic Acid, Venous 2.1 (HH) 0.5 - 1.9 mmol/L  Resp Panel by RT-PCR (Flu A&B, Covid) Nasopharyngeal Swab     Status: None  Collection Time: 07/30/20  2:54 PM   Specimen: Nasopharyngeal  Swab; Nasopharyngeal(NP) swabs in vial transport medium  Result Value Ref Range   SARS Coronavirus 2 by RT PCR NEGATIVE NEGATIVE   Influenza A by PCR NEGATIVE NEGATIVE   Influenza B by PCR NEGATIVE NEGATIVE  Lactic acid, plasma     Status: None   Collection Time: 07/30/20  3:12 PM  Result Value Ref Range   Lactic Acid, Venous 1.0 0.5 - 1.9 mmol/L  Procalcitonin - Baseline     Status: None   Collection Time: 07/30/20  3:12 PM  Result Value Ref Range   Procalcitonin 1.09 ng/mL    D/W Dr Liliane Shi, urology. He reviewed renal U/S which is C/W a patent nephrostomy tube and no evidence of perinephric abscess or hydronephrosis. He recommends treating infection and IV fluids, leave the nephrostomy in place and replace on schedule. If she does not respond appropriately to antibiotics consider replacing it sooner.  Appreciate Dr Zannie Kehr help. His recommendations are noted.  Assessment/Plan: 25 yo G3P1  33 4/7wks      -FWB>FHT cat one               >BPP in am  Left pyelonephritis               >Rocephin 2 gms IV q24 hours               > urine and blood cultures done               >lactic acid normalized and temperature improved               >continue IV fluids and Tylenol for fever  Left nephrostomy tube>scheduled for replacement in about 2 weeks, consider sooner based on clinical course  Nephrolithiasis - left [facility]  Previous C/S-planning on repeat   Roselle Locus II 07/30/2020, 6:18 PM

## 2020-07-30 NOTE — MAU Note (Signed)
Pt reports left sided flank pain and fever since Sunday. S/P left-sided nephrostomy d/t kidney stones. Temp 102.8, HR 130-140s, R 20 upon assessment. FHR 180s. Pt has leg bag.

## 2020-07-30 NOTE — Progress Notes (Signed)
D/W Dr Ninetta Lights of ID Will stop Rocephin Will start cefepime 2 gm IV q12 hours and vancomycin per pharmacy consult

## 2020-07-30 NOTE — MAU Provider Note (Signed)
History     [id number]  Arrival date and time: 07/30/20 1135    Chief Complaint  Patient presents with   Fever   Flank Pain     HPI Robin Walker is a 25 y.o. at [redacted]w[redacted]d with PMHx notable for previous c/section & nephrolithiasis, who presents for left flank pain & fever. Was admitted last month for kidney stones & left nephrostomy tube was placed. Reports current symptoms started yesterday. Has had fever at home - lowest she has been able to get it is 99.2 but has been up to 101.3. Has had increase in left flank pain. Has been taking tylenol without relief of symptoms. Took 2 extra strength tylenol at 530 this morning. Has headache since fever started. Denies sore throat, cough, or sick contacts & reports a negative home covid test yesterday.  Denies contractions, LOF, or vaginal bleeding.  States her tube has been draining dark yellow to pink tinged urine.   Vaginal bleeding: No LOF: No Fetal Movement: Yes Contractions: No  Review of outside prenatal records from [facility] (in media tab): completed  Review of discharge summary from last admission on 06/22/2020:   --/--/O NEG (05/20 1830)  OB History     Gravida  3   Para  1   Term  1   Preterm      AB  1   Living  1      SAB  1   IAB      Ectopic      Multiple  0   Live Births  1           Past Medical History:  Diagnosis Date   History of kidney stones    Hypothyroidism    PONV (postoperative nausea and vomiting)     Past Surgical History:  Procedure Laterality Date   CESAREAN SECTION N/A 01/06/2018   Procedure: CESAREAN SECTION;  Surgeon: Candice Camp, MD;  Location: [facility] BIRTHING SUITES;  Service: Obstetrics;  Laterality: N/A;  Primary edc12/8 NKDA need RNFA   CHOLECYSTECTOMY     IR NEPHROSTOMY PLACEMENT LEFT  06/23/2020    Family History  Problem Relation Age of Onset   Diabetes Mother    Heart disease Father    Hypertension Father    Diabetes Father      Social History   Socioeconomic History   Marital status: Married    Spouse name: Not on file   Number of children: Not on file   Years of education: Not on file   Highest education level: Not on file  Occupational History   Not on file  Tobacco Use   Smoking status: Never   Smokeless tobacco: Never  Vaping Use   Vaping Use: Never used  Substance and Sexual Activity   Alcohol use: Not Currently   Drug use: Not Currently   Sexual activity: Not Currently  Other Topics Concern   Not on file  Social History Narrative   Not on file   Social Determinants of Health   Financial Resource Strain: Not on file  Food Insecurity: Not on file  Transportation Needs: Not on file  Physical Activity: Not on file  Stress: Not on file  Social Connections: Not on file  Intimate Partner Violence: Not on file    No Known Allergies  No current facility-administered medications on file prior to encounter.   Current Outpatient Medications on File Prior to Encounter  Medication Sig Dispense Refill   ciprofloxacin (CIPRO) 500 MG  tablet Take 500 mg by mouth 2 (two) times daily.     ergocalciferol (VITAMIN D2) 1.25 MG (50000 UT) capsule ergocalciferol (vitamin D2) 1,250 mcg (50,000 unit) capsule     levothyroxine (SYNTHROID, LEVOTHROID) 125 MCG tablet Take 125 mcg by mouth daily before breakfast.     Prenat w/o A-FE-Methfol-FA-DHA (PNV-DHA PO) Take 1 each by mouth daily.     Cholecalciferol (VITAMIN D) 50 MCG (2000 UT) tablet Take 2,000 Units by mouth daily.     cyclobenzaprine (FLEXERIL) 5 MG tablet cyclobenzaprine 5 mg tablet  TAKE 1 TABLET 3 TIMES A DAY BY ORAL ROUTE.     ferrous sulfate 325 (65 FE) MG EC tablet Take 325 mg by mouth every evening.     ondansetron (ZOFRAN) 4 MG tablet Take 1 tablet (4 mg total) by mouth every 8 (eight) hours as needed for nausea or vomiting. 20 tablet 0   tamsulosin (FLOMAX) 0.4 MG CAPS capsule Take 1 capsule (0.4 mg total) by mouth daily. 30 capsule 6      Review of Systems  Constitutional:  Positive for chills and fever.  HENT: Negative.    Respiratory: Negative.    Cardiovascular: Negative.   Gastrointestinal:  Positive for nausea. Negative for abdominal pain and vomiting.  Genitourinary:  Positive for flank pain and hematuria.  Neurological:  Positive for headaches.  Pertinent positives and negative per HPI, all others reviewed and negative  Physical Exam   BP (!) 137/56   Pulse (!) 125   Temp 98.9 F (37.2 C)   Resp (!) 21   Ht 5\' 3"  (1.6 m)   Wt 124.4 kg   SpO2 98%   BMI 48.57 kg/m   Physical Exam Constitutional:      Appearance: She is ill-appearing. She is not diaphoretic.  HENT:     Head: Normocephalic and atraumatic.  Eyes:     General: No scleral icterus. Cardiovascular:     Rate and Rhythm: Tachycardia present.  Pulmonary:     Effort: Pulmonary effort is normal. No respiratory distress.  Abdominal:     Tenderness: There is left CVA tenderness. There is no right CVA tenderness.  Skin:    General: Skin is warm and dry.  Neurological:     Mental Status: She is alert.     FHT Baseline 185, moderate variability, 15x15 accels, no decels Toco: UI Cat: 2  Labs Results for orders placed or performed during the hospital encounter of 07/30/20 (from the past 24 hour(s))  Urinalysis, Routine w reflex microscopic OB Catheterized     Status: Abnormal   Collection Time: 07/30/20 12:27 PM  Result Value Ref Range   Color, Urine YELLOW YELLOW   APPearance HAZY (A) CLEAR   Specific Gravity, Urine 1.009 1.005 - 1.030   pH 7.0 5.0 - 8.0   Glucose, UA NEGATIVE NEGATIVE mg/dL   Hgb urine dipstick MODERATE (A) NEGATIVE   Bilirubin Urine NEGATIVE NEGATIVE   Ketones, ur 5 (A) NEGATIVE mg/dL   Protein, ur 08/01/20 (A) NEGATIVE mg/dL   Nitrite POSITIVE (A) NEGATIVE   Leukocytes,Ua MODERATE (A) NEGATIVE   RBC / HPF 6-10 0 - 5 RBC/hpf   WBC, UA 6-10 0 - 5 WBC/hpf   Bacteria, UA RARE (A) NONE SEEN   Squamous Epithelial  / LPF 0-5 0 - 5   Mucus PRESENT   CBC with Differential/Platelet     Status: Abnormal   Collection Time: 07/30/20 12:28 PM  Result Value Ref Range   WBC 23.8 (H)  4.0 - 10.5 K/uL   RBC 3.73 (L) 3.87 - 5.11 MIL/uL   Hemoglobin 10.6 (L) 12.0 - 15.0 g/dL   HCT 40.0 (L) 86.7 - 61.9 %   MCV 87.4 80.0 - 100.0 fL   MCH 28.4 26.0 - 34.0 pg   MCHC 32.5 30.0 - 36.0 g/dL   RDW 50.9 32.6 - 71.2 %   Platelets 262 150 - 400 K/uL   nRBC 0.1 0.0 - 0.2 %   Neutrophils Relative % 89 %   Neutro Abs 21.2 (H) 1.7 - 7.7 K/uL   Lymphocytes Relative 2 %   Lymphs Abs 0.5 (L) 0.7 - 4.0 K/uL   Monocytes Relative 7 %   Monocytes Absolute 1.6 (H) 0.1 - 1.0 K/uL   Eosinophils Relative 0 %   Eosinophils Absolute 0.1 0.0 - 0.5 K/uL   Basophils Relative 0 %   Basophils Absolute 0.1 0.0 - 0.1 K/uL   Immature Granulocytes 2 %   Abs Immature Granulocytes 0.41 (H) 0.00 - 0.07 K/uL  Comprehensive metabolic panel     Status: Abnormal   Collection Time: 07/30/20 12:28 PM  Result Value Ref Range   Sodium 137 135 - 145 mmol/L   Potassium 3.9 3.5 - 5.1 mmol/L   Chloride 107 98 - 111 mmol/L   CO2 18 (L) 22 - 32 mmol/L   Glucose, Bld 145 (H) 70 - 99 mg/dL   BUN <5 (L) 6 - 20 mg/dL   Creatinine, Ser 4.58 0.44 - 1.00 mg/dL   Calcium 8.6 (L) 8.9 - 10.3 mg/dL   Total Protein 6.4 (L) 6.5 - 8.1 g/dL   Albumin 2.7 (L) 3.5 - 5.0 g/dL   AST 18 15 - 41 U/L   ALT 11 0 - 44 U/L   Alkaline Phosphatase 81 38 - 126 U/L   Total Bilirubin 0.8 0.3 - 1.2 mg/dL   GFR, Estimated >09 >98 mL/min   Anion gap 12 5 - 15  Lactic acid, plasma     Status: Abnormal   Collection Time: 07/30/20  1:20 PM  Result Value Ref Range   Lactic Acid, Venous 2.1 (HH) 0.5 - 1.9 mmol/L    Imaging US RENAL  Result Date: 07/30/2020 CLINICAL DATA:  Left flank pain. EXAM: RENAL / URINARY TRACT ULTRASOUND COMPLETE COMPARISON:  Jun 22, 2020. FINDINGS: Right Kidney: Renal measurements: 13.0 x 5.0 x 6.2 cm = volume: 210 mL. Echogenicity within normal limits.  No mass or hydronephrosis visualized. Left Kidney: Renal measurements: 14.5 x 6.1 x 6.3 cm = volume: 291 mL. Echogenicity within normal limits. No mass or hydronephrosis visualized. Bladder: Appears normal for degree of bladder distention. Other: None. IMPRESSION: Normal renal ultrasound. Electronically Signed   By: Lupita Raider M.D.   On: 07/30/2020 14:46    MAU Course  Procedures Lab Orders  OB Urine Culture  Culture, blood (routine x 2)  Urinalysis, Routine w reflex microscopic Urine, Clean Catch  CBC with Differential/Platelet  Comprehensive metabolic panel  Lactic acid, plasma  Lactic acid, plasma  Procalcitonin - Baseline  Procalcitonin  Meds ordered this encounter  Medications   sodium chloride 0.9 % bolus 1,000 mL   ondansetron (ZOFRAN) injection 4 mg   HYDROmorphone (DILAUDID) injection 1 mg   acetaminophen (TYLENOL) tablet 1,000 mg   cefTRIAXone (ROCEPHIN) 2 g in sodium chloride 0.9 % 100 mL IVPB    Order Specific Question:   Antibiotic Indication:    Answer:   UTI   FOLLOWED BY Linked Order Group  sodium chloride 0.9 % bolus 1,000 mL    sodium chloride 0.9 % bolus 1,000 mL   Imaging Orders  US RENAL   MDM Patient at 8263w4d who presents with left flank pain & fever. Has left nephrostomy tube in place due to kidney stones. Presents with temp of 102.8, maternal tachycardia & fetal tachycardia.  Meets criteria for sepsis due to vitals, leukocytosis & elevated lactic acid. Treated with IV fluids (3 liters of normal saline), rocephin 2 gm IV, dilaudid, zofran, & tylenol. Improvement in symptoms & vitals noted.   U/a with nitrites - urine culture pending  Renal ultrasound pending results. Reviewed patient with Dr. Liliane ShiWinter (urology) who reviewed ultrasound images & reports no obstruction. Recommends possible stent replacement by interventional radiology during her admission.   Dr. Henderson Cloudomblin aware of patient, labs, vital signs, and recommendation. [facility]    Assessment and Plan   1. Sepsis due to urinary tract infection (HCC)   2. [redacted] weeks gestation of pregnancy   3. Pregnancy complicated by nephrolithiasis in third trimester, antepartum   -[facility] Henderson [facility]blin [facility]   Judeth HornLawrence, Melaysia Streed, NP

## 2020-07-30 NOTE — Consult Note (Signed)
NAME:  Robin Walker, MRN:  [id number], DOB:  1995/06/05, LOS: 0 ADMISSION DATE:  07/30/2020, CONSULTATION DATE: 6/27 REFERRING MD: Dr. Leland Her, CHIEF COMPLAINT: Sepsis  History of Present Illness:  25 year old female with past medical history as below, which is significant for hypothyroidism.  Admitted to the obstetrical floor on 6/27.  She is G3, P1 and is at 33 and 4/[redacted] weeks gestation.  She has recently been worked up for an obstructing left UPJ calculus.  She is status post nephrostomy tube placement on 5/20.  Course since that time is been uncomplicated until 6/26 around 4 PM when she developed left flank pain as well as nausea and occasional positional dizziness.  The symptoms caused her to call interventional radiology with concern for issues with nephrostomy tube.  They referred her to for admission at [facility].  She was admitted to the obstetrical service and treated with IV Rocephin.  On presentation her lactic acid was elevated but did normalize with IV fluids.  Then later in the evening of 6/27 she again became febrile and developed tachycardia.  Lactic acid bumped back to 2.5.  PCCM was consulted.  Patient currently complaining of intermittent left-sided flank pain as well as postural dizziness.  Having rigors at times with last documented temperature 102.  She expresses significant anxiety related to her current situation.  Pertinent  Medical History   has a past medical history of History of kidney stones, Hypothyroidism, and PONV (postoperative nausea and vomiting).   Significant Hospital Events: Including procedures, antibiotic start and stop dates in addition to other pertinent events     Interim History / Subjective:    Objective   Blood pressure 123/62, pulse (!) 146, temperature (!) 102.6 F (39.2 C), temperature source Axillary, resp. rate 20, height 5\' 3"  (1.6 m), weight 124.4 kg, SpO2 99 %, unknown if currently breastfeeding.        Intake/Output  Summary (Last 24 hours) at 07/30/2020 2226 Last data filed at 07/30/2020 2120 Gross per 24 hour  Intake 1593.25 ml  Output 4350 ml  Net -2756.75 ml   Filed Weights   07/30/20 1222  Weight: 124.4 kg    Examination: General: 25 year old female in no acute distress HENT: Normocephalic, atraumatic, nonfocal. Mucous membranes dry.  Lungs: Clear bilateral breath sounds. Cardiovascular: Regular, tachycardic, no MRG appreciated. Abdomen: Protuberant. Fetal heart rate 180.  Extremities: No acute deformity or range of motion limitation.  No significant edema.  Left-sided nephrostomy tube draining clear yellow urine. Neuro: Alert, oriented, non-focal  Resolved Hospital Problem list     Assessment & Plan:   Pyelonephritis on the left (POA): no obstruction or hydro seen on ultrasound.  Possible sepsis: q-SOFA 0 (POA) - Continue rocephin as you are - urology has seen and is following. Considering nephrostomy replacement depending on clinical course.  - Cultures pending - Low threshold to escalate antibiotics considering indwelling tube may be source.  - Trend procalcitonin  - Tylenol for fever - If she becomes hypotensive or has alteration of mental status will plan for ICU transfer.   Tachycardia: Multifactorial fever, dehydration, infection.  Elevated lactic acid (2.5) - EKG - Lactic acid trend - Ongoing IVF resuscitation. Will give additional 1 L LR bolus (making 5L of bolus fluids since admission) as mucous membranes remain dry and patient is quite thirsty.  - Consider echocardiogram  Hypothyroidism - Check TSH - Continue home synthroid.     Best Practice (right click and "Reselect all SmartList Selections" daily)  Diet/type: Regular consistency (see orders) DVT prophylaxis: other ambulating GI prophylaxis: N/A Lines: N/A Foley:  N/A Code Status:  full code Last date of multidisciplinary goals of care discussion [ ]   Labs   CBC: Recent Labs  Lab 07/30/20 1228  WBC  23.8*  NEUTROABS 21.2*  HGB 10.6*  HCT 32.6*  MCV 87.4  PLT 262    Basic Metabolic Panel: Recent Labs  Lab 07/30/20 1228  NA 137  K 3.9  CL 107  CO2 18*  GLUCOSE 145*  BUN <5*  CREATININE 0.79  CALCIUM 8.6*   GFR: Estimated Creatinine Clearance: 135.4 mL/min (by C-G formula based on SCr of 0.79 mg/dL). Recent Labs  Lab 07/30/20 1228 07/30/20 1320 07/30/20 1512 07/30/20 1822  PROCALCITON  --   --  1.09  --   WBC 23.8*  --   --   --   LATICACIDVEN  --  2.1* 1.0 2.5*    Liver Function Tests: Recent Labs  Lab 07/30/20 1228  AST 18  ALT 11  ALKPHOS 81  BILITOT 0.8  PROT 6.4*  ALBUMIN 2.7*   No results for input(s): LIPASE, AMYLASE in the last 168 hours. No results for input(s): AMMONIA in the last 168 hours.  ABG No results found for: PHART, PCO2ART, PO2ART, HCO3, TCO2, ACIDBASEDEF, O2SAT   Coagulation Profile: No results for input(s): INR, PROTIME in the last 168 hours.  Cardiac Enzymes: No results for input(s): CKTOTAL, CKMB, CKMBINDEX, TROPONINI in the last 168 hours.  HbA1C: No results found for: HGBA1C  CBG: No results for input(s): GLUCAP in the last 168 hours.  Review of Systems:   Bolds are positive  Constitutional: weight loss, gain, night sweats, Fevers, chills, fatigue .  HEENT: headaches, Sore throat, sneezing, nasal congestion, post nasal drip, Difficulty swallowing, Tooth/dental problems, visual complaints visual changes, ear ache CV:  chest pain, radiates:,Orthopnea, PND, swelling in lower extremities slwo progressive over course of pregnancy, postural dizziness, palpitations, syncope.  GI  heartburn, indigestion, abdominal pain, nausea, vomiting, diarrhea, change in bowel habits, loss of appetite, bloody stools. L flank pain Resp: cough, productive: , hemoptysis, dyspnea, chest pain, pleuritic.  Skin: rash or itching or icterus GU: dysuria, change in color of urine, urgency or frequency. flank pain, hematuria  MS: joint pain or  swelling. decreased range of motion  Psych: change in mood or affect. depression or anxiety.  Neuro: difficulty with speech, weakness, numbness, ataxia    Past Medical History:  She,  has a past medical history of History of kidney stones, Hypothyroidism, and PONV (postoperative nausea and vomiting).   Surgical History:   Past Surgical History:  Procedure Laterality Date   CESAREAN SECTION N/A 01/06/2018   Procedure: CESAREAN SECTION;  Surgeon: 14/05/2017, MD;  Location: [facility] BIRTHING SUITES;  Service: Obstetrics;  Laterality: N/A;  Primary edc12/8 NKDA need RNFA   CHOLECYSTECTOMY     IR NEPHROSTOMY PLACEMENT LEFT  06/23/2020     Social History:   reports that she has never smoked. She has never used smokeless tobacco. She reports previous alcohol use. She reports previous drug use.   Family History:  Her family history includes Diabetes in her father and mother; Heart disease in her father; Hypertension in her father.   Allergies No Known Allergies   Home Medications  Prior to Admission medications   Medication Sig Start Date End Date Taking? Authorizing Provider  ciprofloxacin (CIPRO) 500 MG tablet Take 500 mg by mouth 2 (two) times daily. 07/29/20  Yes [provider]  ergocalciferol (VITAMIN D2) 1.25 MG (50000 UT) capsule ergocalciferol (vitamin D2) 1,250 mcg (50,000 unit) capsule 11/18/18  Yes [provider]  levothyroxine (SYNTHROID, LEVOTHROID) 125 MCG tablet Take 125 mcg by mouth daily before breakfast.   Yes [provider]  Prenat w/o A-FE-Methfol-FA-DHA (PNV-DHA PO) Take 1 each by mouth daily.   Yes [provider]  Cholecalciferol (VITAMIN D) 50 MCG (2000 UT) tablet Take 2,000 Units by mouth daily.    [provider]  cyclobenzaprine (FLEXERIL) 5 MG tablet cyclobenzaprine 5 mg tablet  TAKE 1 TABLET 3 TIMES A DAY BY ORAL ROUTE.    [provider]  ferrous sulfate 325 (65 FE) MG EC tablet Take 325 mg by mouth every  evening.    [provider]  ondansetron (ZOFRAN) 4 MG tablet Take 1 tablet (4 mg total) by mouth every 8 (eight) hours as needed for nausea or vomiting. 06/24/20   Mitchel Honour, DO  tamsulosin (FLOMAX) 0.4 MG CAPS capsule Take 1 capsule (0.4 mg total) by mouth daily. 06/24/20   Mitchel Honour, DO     Critical care time:      Joneen Roach, AGACNP-BC [facility]  See Amion for personal pager PCCM on call pager [phone] until 7pm. Please call Elink 7p-7a. [phone]  07/30/2020 11:00 PM

## 2020-07-30 NOTE — Consult Note (Signed)
Maternal-Fetal Medicine   Name: Robin Walker DOB: 1995-10-30 MRN: [id number] Referring Provider: Harold Hedge, MD  I had the pleasure of seeing Robin Walker at [employer]. She is G3 P1 at 33w 4d gestation with known diagnosis of nephrolithiasis and left nephrostomy tube. Today, she is admitted today with fever.  Patient reports she had high fever yesterday (101F) and back pain. Her fever abated after Tylenol. Patient reports she had nausea and vomiting. She was evaluated at the MAU and Rocephin was started. Patient feels better now with no abdominal pain.  Her prenatal course was, otherwise, uneventful. She had low risk for fetal aneuploidies on screening. She does not have gestational diabetes and her blood pressures have been normal at prenatal visits. Fetal anatomical survey and growth assessments were normal.  ROS: Mild headache/no visual disturbances/no chest pain. No vaginal bleeding.   PMH: No history of hypertension or diabetes or any other chronic medical conditions. Past surgical history: Lap cholecystectomy, cesarean section tube insertion. ol, Zofran, prenatal vitamins. Allergies no known drug allergies.  Social history: Denies tobacco or drug or alcohol use.  She has been married for years and her husband is in good health except for hypertension. Family history: No history of venous thromboembolism in the family. Obstetric history significant for a term cesarean delivery (fetal macrosomia) of a female infant weighing 4,545 g at birth. GYN history: No history of abnormal Pap smears or cervical surgeries.  P/E: Patient is comfortably lying in bed; not in pain. Vitals: Blood pressure 116/46 mmHg, pulse 117/min.RR 21/minute, T-max 102.56F. HEENT:; No lymphadenopathy. Abdomen: Soft, gravid uterus; no tenderness in any of the quadrants. Minimal pedal edema. Labs: Pending lactic acid increased (2.1). Renal ultrasound: No mass or hydronephrosis in the kidneys. normal  study.  Our concerns include Acute pyelonephritis is a reasonable and presumptive diagnosis based on her history of nephrolithiasis and acute fever. I counseled the patient that she has risk factors including renal stones and pregnancy.  Pregnancy is associated with increased risk of urinary tract infections. Acute pyelonephritis should be treated with antibiotics and hydration.  I encouraged the patient to increase her fluid intake. Broad-spectrum antibiotics was started initially and later may change to specific antibiotic based on culture reports. If pyelonephritis is confirmed, it should be aggressively treated to prevent complications including acute respiratory distress syndrome, which occurs in 2 to 5% of the patients. I informed the patient that after treatment, we recommend prophylactic antibiotics till delivery. It is reassuring that on ultrasound there is no evidence of perinephric abscess and the patient should respond well to antibiotics. Acute pyelonephritis can also increase the risk of preterm delivery that should reduce with treatment of  infection.   Previous cesarean section Patient is keen on having repeat cesarean delivery because of fetal macrosomia diagnosed in her last pregnancy. I counseled her that repeat cesarean deliveries increase the likelihood of placenta previa or accreta.  Recommendations: -Continue antibiotics and follow culture reports. -Improve hydration. -BPP tomorrow. -Patient may be discharged once fever abates for 24 hours. -Usual prophylaxis till delivery after treatment. -Follow-up with Urology.  Thank you for consultation. If you have any questions, please contact me at the [facility]. Consultation including face-to-face counseling 45 minutes.

## 2020-07-31 ENCOUNTER — Inpatient Hospital Stay (HOSPITAL_BASED_OUTPATIENT_CLINIC_OR_DEPARTMENT_OTHER): Payer: BC Managed Care – PPO

## 2020-07-31 DIAGNOSIS — O99213 Obesity complicating pregnancy, third trimester: Secondary | ICD-10-CM

## 2020-07-31 DIAGNOSIS — Z3A33 33 weeks gestation of pregnancy: Secondary | ICD-10-CM

## 2020-07-31 DIAGNOSIS — E669 Obesity, unspecified: Secondary | ICD-10-CM | POA: Diagnosis not present

## 2020-07-31 DIAGNOSIS — R6520 Severe sepsis without septic shock: Secondary | ICD-10-CM

## 2020-07-31 DIAGNOSIS — A419 Sepsis, unspecified organism: Secondary | ICD-10-CM

## 2020-07-31 DIAGNOSIS — O2303 Infections of kidney in pregnancy, third trimester: Secondary | ICD-10-CM | POA: Diagnosis not present

## 2020-07-31 LAB — COMPREHENSIVE METABOLIC PANEL
ALT: 12 U/L (ref 0–44)
AST: 14 U/L — ABNORMAL LOW (ref 15–41)
Albumin: 2.2 g/dL — ABNORMAL LOW (ref 3.5–5.0)
Alkaline Phosphatase: 72 U/L (ref 38–126)
Anion gap: 13 (ref 5–15)
BUN: <5 mg/dL — ABNORMAL LOW (ref 6–20)
CO2: 15 mmol/L — ABNORMAL LOW (ref 22–32)
Calcium: 8.6 mg/dL — ABNORMAL LOW (ref 8.9–10.3)
Chloride: 111 mmol/L (ref 98–111)
Creatinine, Ser: 0.76 mg/dL (ref 0.44–1.00)
GFR, Estimated: >60 mL/min (ref >60)
Glucose, Bld: 164 mg/dL — ABNORMAL HIGH (ref 70–99)
Potassium: 3.6 mmol/L (ref 3.5–5.1)
Sodium: 139 mmol/L (ref 135–145)
Total Bilirubin: 0.4 mg/dL (ref 0.3–1.2)
Total Protein: 5.5 g/dL — ABNORMAL LOW (ref 6.5–8.1)

## 2020-07-31 LAB — CBC
HCT: 27.7 % — ABNORMAL LOW (ref 36.0–46.0)
Hemoglobin: 9.3 g/dL — ABNORMAL LOW (ref 12.0–15.0)
MCH: 29.1 pg (ref 26.0–34.0)
MCHC: 33.6 g/dL (ref 30.0–36.0)
MCV: 86.6 fL (ref 80.0–100.0)
Platelets: 205 10*3/uL (ref 150–400)
RBC: 3.2 MIL/uL — ABNORMAL LOW (ref 3.87–5.11)
RDW: 14.3 % (ref 11.5–15.5)
WBC: 20.8 10*3/uL — ABNORMAL HIGH (ref 4.0–10.5)
nRBC: 0 % (ref 0.0–0.2)

## 2020-07-31 LAB — PHOSPHORUS: Phosphorus: 3.1 mg/dL (ref 2.5–4.6)

## 2020-07-31 LAB — MAGNESIUM: Magnesium: 1.4 mg/dL — ABNORMAL LOW (ref 1.7–2.4)

## 2020-07-31 LAB — TSH: TSH: 1.825 u[IU]/mL (ref 0.350–4.500)

## 2020-07-31 LAB — PROCALCITONIN: Procalcitonin: 7.75 ng/mL

## 2020-07-31 LAB — LACTIC ACID, PLASMA
Lactic Acid, Venous: 1.7 mmol/L (ref 0.5–1.9)
Lactic Acid, Venous: 2.5 mmol/L (ref 0.5–1.9)

## 2020-07-31 MED ORDER — SODIUM CHLORIDE 0.9 % IV SOLN
25.0000 mg | Freq: Three times a day (TID) | INTRAVENOUS | Status: DC | PRN
  Administered 2020-07-31 – 2020-08-02 (×3): 25 mg via INTRAVENOUS
  Filled 2020-07-31 (×4): qty 1

## 2020-07-31 MED ORDER — CYCLOBENZAPRINE HCL 10 MG PO TABS
10.0000 mg | ORAL_TABLET | Freq: Three times a day (TID) | ORAL | Status: DC | PRN
  Administered 2020-07-31 – 2020-08-01 (×2): 10 mg via ORAL
  Filled 2020-07-31 (×2): qty 1

## 2020-07-31 MED ORDER — POTASSIUM CHLORIDE CRYS ER 20 MEQ PO TBCR
40.0000 meq | EXTENDED_RELEASE_TABLET | Freq: Once | ORAL | Status: AC
  Administered 2020-07-31: 40 meq via ORAL
  Filled 2020-07-31: qty 2

## 2020-07-31 MED ORDER — HYDROMORPHONE HCL 2 MG PO TABS
2.0000 mg | ORAL_TABLET | ORAL | Status: DC | PRN
  Administered 2020-07-31 – 2020-08-02 (×6): 2 mg via ORAL
  Filled 2020-07-31 (×8): qty 1

## 2020-07-31 MED ORDER — MAGNESIUM SULFATE 2 GM/50ML IV SOLN
2.0000 g | Freq: Once | INTRAVENOUS | Status: AC
  Administered 2020-07-31: 2 g via INTRAVENOUS
  Filled 2020-07-31: qty 50

## 2020-07-31 MED ORDER — VANCOMYCIN HCL 10 G IV SOLR
2000.0000 mg | Freq: Two times a day (BID) | INTRAVENOUS | Status: DC
  Filled 2020-07-31: qty 2000

## 2020-07-31 MED ORDER — LACTATED RINGERS IV SOLN: INTRAVENOUS | Status: DC

## 2020-07-31 MED ORDER — HYDROMORPHONE HCL 1 MG/ML IJ SOLN
1.0000 mg | Freq: Once | INTRAMUSCULAR | Status: AC
  Administered 2020-07-31: 1 mg via INTRAVENOUS
  Filled 2020-07-31: qty 1

## 2020-07-31 MED ORDER — SODIUM CHLORIDE 0.9 % IV SOLN
2.0000 g | Freq: Three times a day (TID) | INTRAVENOUS | Status: DC
  Administered 2020-07-31 – 2020-08-02 (×7): 2 g via INTRAVENOUS
  Filled 2020-07-31 (×9): qty 2

## 2020-07-31 NOTE — Progress Notes (Signed)
Patient ID: Robin Walker, female   DOB: [birth year], 25 y.o.   MRN: [id number] Fifi feels much better this morning GFM Pain is mild on left only Just got back from [place] and per pt got 8/8BPP  VSSAF Temp (F)     98.1-102.9 Important  98.1  98.1 (36.7)  06/28 0700  Pulse Rate     97-146 Important  94  94  06/28 0700  Resp     19-21 Important  20  20  06/28 0700  BP     96/42 Important -137/56 Important  105/53 Important   105/53 Important   06/28 0700  SpO2 (%)     93-100 95  95  06/28 0700  Weight (kg)  124.4    124.4 kg  06/27 1222  FHR 140s with accel no decels Cat 1 Ctxs occas mild  Abd Gravid nt Neg homans bilaterally !/5 flank tenderness on left.  None on right flank  IUP at 33 5/7 Pyelonephritis- Abx D1 - cefepime 2 gm IV q12 hours and vancomycin per pharmacy Afebrile and improving labs.  Culture pending Complicated by nephroliasis and PCN on left followed by urology  Fetal well being - normal EFM and BPP Change to nst q shift

## 2020-07-31 NOTE — Progress Notes (Signed)
This morning pt noted to have erythema over ischial tuberosities with raised rash.  Pt denies pain or itching.  Patient had been in semifowler position all night d/t possible sepsis sx and need for continuous fetal monitoring.  Has also had some diarrhea in last 8 hours.  Assisted patient in alternating between right and left tilt positions.  Spoke to S. Leonard Schwartz over the phone who guided this RN regarding further assessment questions.  Upon reassessment this afternoon, erythema lessened over area.  Area is not warm to tough and easily blanches.  Rash appeared to be irritation of hair follicles, no vecicles or pustules noted.  Pt to continue alternating position every hour or so and RN will continue to monitor.

## 2020-07-31 NOTE — Progress Notes (Addendum)
Subjective: Patient feeling better much less pain.  Fever has subsided today.  Tolerating p.o.'s.  White blood cell count still 20,000.  Objective: Vital signs in last 24 hours: Temp:  [98.1 F (36.7 C)-102.9 F (39.4 C)] 98.1 F (36.7 C) (06/28 1130) Pulse Rate:  [94-146] 110 (06/28 1130) Resp:  [19-21] 20 (06/28 1130) BP: (96-137)/(36-62) 106/49 (06/28 1130) SpO2:  [93 %-100 %] 97 % (06/28 1130)  Intake/Output from previous day: 06/27 0701 - 06/28 0700 In: 4312.9 [P.O.:1320; I.V.:824.6; IV Piggyback:2168.3] Out: 7675 [Urine:7675] Intake/Output this shift: Total I/O In: 360 [P.O.:360] Out: 2700 [Urine:2700]  Physical Exam:  General: Alert and oriented  Lab Results: Recent Labs    07/30/20 1228 07/31/20 0532  HGB 10.6* 9.3*  HCT 32.6* 27.7*   BMET Recent Labs    07/30/20 2333 07/31/20 0532  NA 136 139  K 3.0* 3.6  CL 111 111  CO2 16* 15*  GLUCOSE 168* 164*  BUN <5* <5*  CREATININE 0.80 0.76  CALCIUM 8.1* 8.6*     Studies/Results: US RENAL  Result Date: 07/30/2020 CLINICAL DATA:  Left flank pain. EXAM: RENAL / URINARY TRACT ULTRASOUND COMPLETE COMPARISON:  Jun 22, 2020. FINDINGS: Right Kidney: Renal measurements: 13.0 x 5.0 x 6.2 cm = volume: 210 mL. Echogenicity within normal limits. No mass or hydronephrosis visualized. Left Kidney: Renal measurements: 14.5 x 6.1 x 6.3 cm = volume: 291 mL. Echogenicity within normal limits. No mass or hydronephrosis visualized. Bladder: Appears normal for degree of bladder distention. Other: None. IMPRESSION: Normal renal ultrasound. Electronically Signed   By: Lupita Raider M.D.   On: 07/30/2020 14:46   [place] MFM FETAL BPP WO NON STRESS  Result Date: 07/31/2020 ----------------------------------------------------------------------  OBSTETRICS REPORT                       (Signed Final 07/31/2020 10:12 am) ---------------------------------------------------------------------- Patient Info  ID #:       [id number]                           D.O.B.:  Sep 11, 1995 (25 yrs)  Name:       Robin Walker            Visit Date: 07/31/2020 08:21 am ---------------------------------------------------------------------- Performed By  Attending:        Noralee Space MD        Ref. Address:     [address]                                                             [place]  [address]  Performed By:     Marcellina Millin       [facility]  Referred By:      Harold Hedge                    MD ---------------------------------------------------------------------- Orders  #  Description                           Code        Ordered By  1  [place] MFM FETAL BPP WO NON               76819.01    [facility]     STRESS ----------------------------------------------------------------------  #  Order #                     Accession #                Episode #  1  [id number]                   [id number]                 [id number] ---------------------------------------------------------------------- Indications  Medical complication of pregnancy              O26.90  (Pyelonephritis and sepsis)  [redacted] weeks gestation of pregnancy                Z3A.33  Obesity complicating pregnancy (BMI 48)        O99.210 E66.9 ---------------------------------------------------------------------- Fetal Evaluation  Num Of Fetuses:         1  Fetal Heart Rate(bpm):  123  Cardiac Activity:       Observed  Presentation:           Cephalic  Placenta:               Anterior  Amniotic Fluid  AFI FV:      Within normal limits  AFI Sum(cm)     %Tile       Largest Pocket(cm)  24.12           92          8.5  RUQ(cm)       RLQ(cm)       LUQ(cm)        LLQ(cm)  8.5           2.29          7.53           5.8  ---------------------------------------------------------------------- Biophysical Evaluation  Amniotic F.V:   Pocket => 2 cm             F. Tone:        Observed  F. Movement:    Observed                   Score:          8/8  F. Breathing:   Observed ---------------------------------------------------------------------- Gestational Age  Best:  33w 5d     Det. By:  Marcella Dubs         EDD:   09/13/20 ---------------------------------------------------------------------- Anatomy  Stomach:               Appears normal, left   Bladder:                Appears normal                         sided  Kidneys:               Appear normal ---------------------------------------------------------------------- Impression  Patient is admitted with diagnosis of acute pyelonephritis.  Amniotic fluid is in the upper limit of normal and good fetal  activity is seen .Antenatal testing is reassuring. BPP 8/8.  Cephalic presentation. ----------------------------------------------------------------------                  Noralee Space, MD Electronically Signed Final Report   07/31/2020 10:12 am ----------------------------------------------------------------------   Assessment/Plan: 1.  Probable nonobstructive pyelonephritis of left kidney with indwelling nephrostomy tube and known left ureteral calculus and renal calculus, 34-week IUP Plan/recommendation.  Await urine and blood cultures. All cultures negative so far in urine/blood. If urine culture positive need to make sure antibiotics sensitive to bacteria and then would arrange for nephrostomy tube exchange in interventional radiology.  Probably good idea to have antibiotics on board for 24 to 48 hours prior to procedure if possible to prevent possible bacteremia with intervention.Will discuss timing of NT exchange with IR.  Discussed plan with patient and she is agreeable.    LOS: 1 day   Belva Agee 07/31/2020, 1:01 PM

## 2020-07-31 NOTE — Consult Note (Signed)
Pharmacy Antibiotic Note  Robin Walker is a 25 y.o. female admitted on 07/30/2020 with  pyelonephritis .  Pharmacy has been consulted for vancomycin dosing.  Plan: Vancomycin 20 mg/kg loading dose, followed by vancomycin 2 G IV q12h per AUC/MIC calculation.  Monitor renal function. Will plan to obtain vancomycin trough level prior to 4th dose if therapy is continued.  Height: 5\' 3"  (160 cm) Weight: 124.4 kg (274 lb 3.2 oz) IBW/kg (Calculated) : 52.4  Temp (24hrs), Avg:99.9 F (37.7 C), Min:98.1 F (36.7 C), Max:102.9 F (39.4 C)  Recent Labs  Lab 07/30/20 1228 07/30/20 1320 07/30/20 1512 07/30/20 1822 07/30/20 2333 07/30/20 2334  WBC 23.8*  --   --   --   --   --   CREATININE 0.79  --   --   --  0.80  --   LATICACIDVEN  --  2.1* 1.0 2.5*  --  2.5*    Estimated Creatinine Clearance: 135.4 mL/min (by C-G formula based on SCr of 0.8 mg/dL).    No Known Allergies  Antimicrobials this admission: Ceftriaxone 2 G IV x 1 (6/27) Cefepime 2 G IV Q8h  (6/28 >>)   Dose adjustments this admission:   Microbiology results: 6/27 BCx: pending 6/27 UCx: pending   Thank you for allowing pharmacy to be a part of this patient's care.  7/27, PharmD, BCPPS 07/31/2020 3:35 AM

## 2020-07-31 NOTE — Progress Notes (Addendum)
NAME:  Robin Walker, MRN:  [id number], DOB:  01/16/1996, LOS: 1 ADMISSION DATE:  07/30/2020, CONSULTATION DATE: 6/27 REFERRING MD: Dr. Leland Her, CHIEF COMPLAINT: Sepsis  History of Present Illness:  25 year old female with past medical history as below, which is significant for hypothyroidism.  Admitted to the obstetrical floor on 6/27.  She is G3, P1 and is at 33 and 4/[redacted] weeks gestation.  She has recently been worked up for an obstructing left UPJ calculus.  She is status post nephrostomy tube placement on 5/20.  Course since that time is been uncomplicated until 6/26 around 4 PM when she developed left flank pain as well as nausea and occasional positional dizziness.  The symptoms caused her to call interventional radiology with concern for issues with nephrostomy tube.  They referred her to for admission at [facility].  She was admitted to the obstetrical service and treated with IV Rocephin.  On presentation her lactic acid was elevated but did normalize with IV fluids.  Then later in the evening of 6/27 she again became febrile and developed tachycardia.  Lactic acid bumped back to 2.5.  PCCM was consulted.    Pertinent  Medical History   has a past medical history of History of kidney stones, Hypothyroidism, and PONV (postoperative nausea and vomiting).   Significant Hospital Events: Including procedures, antibiotic start and stop dates in addition to other pertinent events     Interim History / Subjective:  Complains of left flank pain  Last fever 102.9 at 10 PM    Objective   Blood pressure (!) 106/49, pulse (!) 110, temperature 98.1 F (36.7 C), temperature source Oral, resp. rate 20, height 5\' 3"  (1.6 m), weight 124.4 kg, SpO2 97 %, unknown if currently breastfeeding.        Intake/Output Summary (Last 24 hours) at 07/31/2020 1144 Last data filed at 07/31/2020 1131 Gross per 24 hour  Intake 4672.91 ml  Output 9450 ml  Net -4777.09 ml    Filed Weights    07/30/20 1222  Weight: 124.4 kg    Examination: General: Young pregnant woman, no acute distress HENT: Normocephalic, atraumatic, nonfocal. Mucous membranes dry.  Lungs: No accessory muscle use, clear with sounds Cardiovascular: S1-S2 tacky, regular Abdomen: Protuberant.  Extremities: No acute deformity or range of motion limitation.  No significant edema.  Left-sided nephrostomy tube draining clear yellow urine. Neuro: Alert, oriented, non-focal  Resolved Hospital Problem list     Assessment & Plan:   Pyelonephritis on the left (POA): no obstruction or hydro seen on ultrasound.  Severe sepsis:  - Continue antibiotics and simplify based on urine culture -dc vanc Urology following  Tachycardia: Multifactorial fever, dehydration, infection.  Elevated lactic acid (2.5) -Resolved  Hypothyroidism -  TSH nml - Continue home synthroid.   Hypomagnesemia will be repleted  PCCM available as needed    Best Practice (right click and "Reselect all SmartList Selections" daily)   Diet/type: Regular consistency (see orders) DVT prophylaxis: other ambulating GI prophylaxis: N/A Lines: N/A Foley:  N/A Code Status:  full code Last date of multidisciplinary goals of care discussion [ ]   Labs   CBC: Recent Labs  Lab 07/30/20 1228 07/31/20 0532  WBC 23.8* 20.8*  NEUTROABS 21.2*  --   HGB 10.6* 9.3*  HCT 32.6* 27.7*  MCV 87.4 86.6  PLT 262 205     Basic Metabolic Panel: Recent Labs  Lab 07/30/20 1228 07/30/20 2333 07/31/20 0532  NA 137 136 139  K 3.9 3.0* 3.6  CL 107 111 111  CO2 18* 16* 15*  GLUCOSE 145* 168* 164*  BUN <5* <5* <5*  CREATININE 0.79 0.80 0.76  CALCIUM 8.6* 8.1* 8.6*  MG  --   --  1.4*  PHOS  --   --  3.1    GFR: Estimated Creatinine Clearance: 135.4 mL/min (by C-G formula based on SCr of 0.76 mg/dL). Recent Labs  Lab 07/30/20 1228 07/30/20 1320 07/30/20 1512 07/30/20 1822 07/30/20 2334 07/31/20 0532  PROCALCITON  --   --  1.09  --    --  7.75  WBC 23.8*  --   --   --   --  20.8*  LATICACIDVEN  --    < > 1.0 2.5* 2.5* 1.7   < > = values in this interval not displayed.     Liver Function Tests: Recent Labs  Lab 07/30/20 1228 07/31/20 0532  AST 18 14*  ALT 11 12  ALKPHOS 81 72  BILITOT 0.8 0.4  PROT 6.4* 5.5*  ALBUMIN 2.7* 2.2*     Cyril Mourning MD. Tonny Bollman. [place] [facility] Pager : [phone]  If no response to pager , please call [phone] until 7 pm After 7:00 pm call Elink  [phone]    07/31/2020 11:44 AM

## 2020-07-31 NOTE — Progress Notes (Signed)
     Probable nonobstructive pyelonephritis of left kidney with indwelling nephrostomy tube and known left ureteral calculus and renal calculus, 34-week IUP  Request for L PCN exchange per MD  Spoke to Dr Benancio Deeds-- he wants to wait for PCN exchange for Thursday am 08/02/20.  Pt is aware and agreeable. Consent signed and in IR

## 2020-08-01 LAB — CULTURE, OB URINE
Culture: 60000 — AB
Special Requests: NORMAL

## 2020-08-01 LAB — CBC
HCT: 27 % — ABNORMAL LOW (ref 36.0–46.0)
Hemoglobin: 8.9 g/dL — ABNORMAL LOW (ref 12.0–15.0)
MCH: 28.9 pg (ref 26.0–34.0)
MCHC: 33 g/dL (ref 30.0–36.0)
MCV: 87.7 fL (ref 80.0–100.0)
Platelets: 223 10*3/uL (ref 150–400)
RBC: 3.08 MIL/uL — ABNORMAL LOW (ref 3.87–5.11)
RDW: 14.3 % (ref 11.5–15.5)
WBC: 14.8 10*3/uL — ABNORMAL HIGH (ref 4.0–10.5)
nRBC: 0 % (ref 0.0–0.2)

## 2020-08-01 LAB — PROCALCITONIN: Procalcitonin: 6.85 ng/mL

## 2020-08-01 LAB — PHOSPHORUS: Phosphorus: 2.6 mg/dL (ref 2.5–4.6)

## 2020-08-01 MED ORDER — ACETAMINOPHEN 500 MG PO TABS
1000.0000 mg | ORAL_TABLET | Freq: Once | ORAL | Status: AC
  Administered 2020-08-01: 1000 mg via ORAL
  Filled 2020-08-01: qty 2

## 2020-08-01 MED ORDER — HYDROMORPHONE HCL 1 MG/ML IJ SOLN
2.0000 mg | Freq: Once | INTRAMUSCULAR | Status: AC
  Administered 2020-08-01: 2 mg via INTRAVENOUS
  Filled 2020-08-01: qty 2

## 2020-08-01 MED ORDER — VANCOMYCIN HCL 10 G IV SOLR
2000.0000 mg | Freq: Two times a day (BID) | INTRAVENOUS | Status: DC
  Administered 2020-08-01 (×2): 2000 mg via INTRAVENOUS
  Filled 2020-08-01 (×4): qty 2000

## 2020-08-01 NOTE — Progress Notes (Signed)
Patient ID: Robin Walker, female   DOB: 1995/08/30, 25 y.o.   MRN: [id number] Ms. Dion is feeling greatly improved after passing three kidney stones overnight. One was at least 4-14mm. She is hoping to not need another nephrostomy tube. She reports feeling GFM and denies contractions.  Last requiring IV pain med immediately before passing all three stones. Since then back pain has improved. Denies contractions, LOF or VB.   Vitals:   08/01/20 0030 08/01/20 0437  BP: (!) 112/37 (!) 127/49  Pulse: (!) 123 (!) 120  Resp: 20 20  Temp:  98.8 F (37.1 C)  SpO2: 97%     Abd Gravid nt Neg homans bilaterally Bilateral NO CVA tenderness  IUP at 33.6 wga admitted with pyelo c/b known kidney stones requiring nephrostomy tube on left  # Pyelonephritis-  - UCX with enteroccocus sensistive to ampicillin and vanc. Spoke with pharmacy who recommended continue both.  - Vanc was discontinued by MICU team - has only missed one dose. Will restart. - Continue cefepime 2 gm IV q12 hours per pharmacy to cover GNRs given BCX not final - Afebrile and improving labs. - Urology following and has planned nephrostomy exchange tomorrow AM. Given she has passed stones, will readdress. Appreciate their care.   # Fetal well being - continue NST q shift, overall reassuring. BPP 8/8 yesterday   Rosie Fate MD

## 2020-08-01 NOTE — Progress Notes (Signed)
Pt @ 0010 voided and passed three kidney stones. RN collected in urine sample cup.

## 2020-08-02 ENCOUNTER — Inpatient Hospital Stay (HOSPITAL_COMMUNITY): Payer: BC Managed Care – PPO

## 2020-08-02 HISTORY — PX: IR NEPHROSTOMY EXCHANGE LEFT: IMG6069

## 2020-08-02 LAB — PHOSPHORUS: Phosphorus: 3.3 mg/dL (ref 2.5–4.6)

## 2020-08-02 LAB — CBC
HCT: 27.5 % — ABNORMAL LOW (ref 36.0–46.0)
Hemoglobin: 9.2 g/dL — ABNORMAL LOW (ref 12.0–15.0)
MCH: 28.8 pg (ref 26.0–34.0)
MCHC: 33.5 g/dL (ref 30.0–36.0)
MCV: 85.9 fL (ref 80.0–100.0)
Platelets: 233 10*3/uL (ref 150–400)
RBC: 3.2 MIL/uL — ABNORMAL LOW (ref 3.87–5.11)
RDW: 14.3 % (ref 11.5–15.5)
WBC: 12.4 10*3/uL — ABNORMAL HIGH (ref 4.0–10.5)
nRBC: 0 % (ref 0.0–0.2)

## 2020-08-02 MED ORDER — SODIUM CHLORIDE 0.9 % IV SOLN
2.0000 g | Freq: Three times a day (TID) | INTRAVENOUS | Status: DC
  Administered 2020-08-02 – 2020-08-03 (×4): 2 g via INTRAVENOUS
  Filled 2020-08-02 (×5): qty 2000

## 2020-08-02 MED ORDER — LIDOCAINE HCL 1 % IJ SOLN
INTRAMUSCULAR | Status: AC
  Filled 2020-08-02: qty 20

## 2020-08-02 MED ORDER — LIDOCAINE HCL (PF) 1 % IJ SOLN
INTRAMUSCULAR | Status: DC | PRN
  Administered 2020-08-02: 10 mL

## 2020-08-02 MED ORDER — IOHEXOL 300 MG/ML  SOLN
50.0000 mL | Freq: Once | INTRAMUSCULAR | Status: AC | PRN
  Administered 2020-08-02: 10 mL

## 2020-08-02 NOTE — Progress Notes (Signed)
Chart note: Spoke to patient on phone this morning.  She passed a stone yesterday.  She was reluctant to get nephrostomy changed by interventional radiology and was hoping to get the nephrostomy tube removed.  The patient does have known left renal calculus and I told her without further imaging with repeat CT scan I would prefer to exchange the nephrostomy tube because the stone that was in the kidney at time of nephrostomy placement was probably too large to pass and probably is still there.  She understands the situation and agrees to proceed with nephrostomy tube exchange.  I spoke to interventional radiology and they may get her back down later this afternoon for nephrostomy change if not get her on the schedule for morning tomorrow.

## 2020-08-02 NOTE — Progress Notes (Signed)
Called for pt to come to IR for procedure this morning. PT states that she wants to talk to MD before coming for procedure. Will attempt to bring pt to IR later if schedule allows.

## 2020-08-02 NOTE — Progress Notes (Signed)
Antepartum Progress Note   Robin Walker is a 25 y.o. female G3P1011 at [redacted]w[redacted]d that is admitted to [facility] for pyelonephritis.  Overnight, she reports no acute events.  She is nervous about stent exchange.  Denies fever, chills, SOB, CP. She has residual L flank pain but improved since passage of stones. Admits fetal movement, denies regular contractions, denies leakage of fluid, denies vaginal bleeding.  History   Blood pressure 135/61, pulse (!) 109, temperature 98 F (36.7 C), temperature source Oral, resp. rate 18, height 5\' 3"  (1.6 m), weight 124.4 kg, SpO2 100 %, unknown if currently breastfeeding. Exam  Physical Exam: Gen: Alert, well appearing, no distress Chest: nonlabored breathing CV: no peripheral edema Abdomen: gravid, soft and nontender Ext: no evidence of DVT  FHT: reactive and reassuring  Assessment/Plan: Admitted to [facility] for pyelonephritis Urine culture positive for enterococcus. Current Abx: cefepime 2 g IV q 8h, vancomycin 2 g IV q12h. Previous rounding MD spoke with pharmacy with plans to transition to ampicillin today. Will order. Leukocytosis improved: 20.8 > 14.8 > 12.4 Urology and IR following, planning for left nephrostomy tube exchange this AM. Appreciate recs. EFM and toco q shift.  BPP 08/01/20 was 8/8. DVT Ppx: SCDs, ambulation  08/03/20 08/02/2020, 7:21 AM

## 2020-08-03 LAB — PHOSPHORUS: Phosphorus: 4.4 mg/dL (ref 2.5–4.6)

## 2020-08-03 LAB — CBC
HCT: 28 % — ABNORMAL LOW (ref 36.0–46.0)
Hemoglobin: 9.2 g/dL — ABNORMAL LOW (ref 12.0–15.0)
MCH: 27.9 pg (ref 26.0–34.0)
MCHC: 32.9 g/dL (ref 30.0–36.0)
MCV: 84.8 fL (ref 80.0–100.0)
Platelets: 274 10*3/uL (ref 150–400)
RBC: 3.3 MIL/uL — ABNORMAL LOW (ref 3.87–5.11)
RDW: 13.9 % (ref 11.5–15.5)
WBC: 10 10*3/uL (ref 4.0–10.5)
nRBC: 0 % (ref 0.0–0.2)

## 2020-08-03 MED ORDER — AMPICILLIN 500 MG PO CAPS: 500.0000 mg | ORAL_CAPSULE | Freq: Four times a day (QID) | ORAL | 0 refills | Status: DC

## 2020-08-03 MED ORDER — CEPHALEXIN 500 MG PO CAPS
500.0000 mg | ORAL_CAPSULE | Freq: Every day | ORAL | 3 refills | Status: DC
Start: 2020-08-03 — End: 2023-04-28

## 2020-08-03 MED ORDER — HYDROMORPHONE HCL 2 MG PO TABS: 2.0000 mg | ORAL_TABLET | ORAL | 0 refills | Status: DC | PRN

## 2020-08-03 NOTE — Progress Notes (Signed)
Patient ID: Robin Walker, female   DOB: [birth year], 25 y.o.   MRN: [id number] Patient is feeling much better today POD#1 s/p left neph tube replacement.  She has an appetite today.  No fever or chills.  Pain is positional on left flank related to tube.    Vitals:   08/03/20 1138 08/03/20 1547  BP: 121/71 (!) 142/69  Pulse: 100 96  Resp: 18 18  Temp: 97.6 F (36.4 C) 97.6 F (36.4 C)  SpO2: 97% 100%   CBC Latest Ref Rng & Units 08/03/2020 08/02/2020 08/01/2020  WBC 4.0 - 10.5 K/uL 10.0 12.4(H) 14.8(H)  Hemoglobin 12.0 - 15.0 g/dL 2.7(X) 4.1(O) 8.9(L)  Hematocrit 36.0 - 46.0 % 28.0(L) 27.5(L) 27.0(L)  Platelets 150 - 400 K/uL 274 233 223   NST reactive Gen: A&O x 3 Abd: soft, NT Ext: no c/c/e MSK: no CVA tenderness bilaterally  25yo G3P1011 at [redacted]w[redacted]d admitted with pyelonephritis -nephrolithiasis -left nephrostomy tube initially placed 5/21  Assessment/Plan: Admitted to [facility] for pyelonephritis -Urine culture positive for enterococcus. -Current Abx: D2 ampicillin  -Leukocytosis improved: 20.8 > 14.8 > 12.4 >10 -Urology and IR following.  S/P left neph tube replacement yesterday -Given overall improvement, will d/c home with completion of ampicillin course and Keflex for pyelo prophylaxis for remainder of pregnancy -Patient requests Dilaudid to use prn pain related to neph tube on discharge.  Will rx.  Patient is cautioned to use judiciously.   -F/U with urology on Wednesday (already scheduled) and in our office on Wednesday (office will call her to schedule).  Mitchel Honour, DO

## 2020-08-03 NOTE — Discharge Instructions (Signed)
Call MD for T>100.4, severe abdominal pain, or respiratory distress.  Office will call patient to schedule visit next week.  Follow up with urology as scheduled on Wednesday.

## 2020-08-03 NOTE — Plan of Care (Signed)
Patient to be discharged with printed instructions. Robin Guise L Deanza Upperman, RN  

## 2020-08-03 NOTE — Discharge Summary (Signed)
Postpartum Discharge Summary    Patient Name: Robin Walker DOB: October 11, 1995 MRN: [id number]  Date of admission: 07/30/2020 Date of discharge: 08/03/2020  Admitting diagnosis: Pyelonephritis affecting pregnancy in third trimester [O23.03] Intrauterine pregnancy: [redacted]w[redacted]d     Secondary diagnosis:  Active Problems:   Pyelonephritis affecting pregnancy in third trimester   Severe sepsis [facility])  Additional problems: nephrolithiasis, left percutaneous nephrostomy tube    Discharge diagnosis:  same as above with replacement of left nephrostomy tube                                                Hospital course: Patient was admitted on 6/27 with diagnosis of pyelonephritis and sepsis.  IV Rocephin was initiated and critical care consulted.  She received IVF and pain control.  Urine and blood cultures were obtained.  Ultrasound showed no hydronephrosis or obstruction.  Antibiotics were switched to cefepime and gentamicin per IDs recommendations on HD1.  Dr. Benancio Deeds of urology consulted on patient on HD1 and recommended neph tube replacement by IR after 48 hours of antibiotics which was done on HD 4 without complication.  Urine cx returned positive for enterococcus sensitive to ampicillin and vanc.  IV ampicillin was initiated.  Blood cx had no growth after 4 days but on day of discharge (HD5) were still pending final read.  Fetal monitoring was reassuring throughout her stay. On day of discharge, patient was feeling much improved with pain well controlled.  She was eating and drinking normally.  Patient was discharged home with completion of ampicillin course and pyelonephritis prophylaxis (Keflex).  She has appointment scheduled with urology on Wednesday.  Physical exam  Vitals:   08/03/20 0353 08/03/20 0818 08/03/20 1138 08/03/20 1547  BP: 130/72 133/73 121/71 (!) 142/69  Pulse: (!) 106 (!) 102 100 96  Resp: 18 18 18 18   Temp: 98.3 F (36.8 C) 97.9 F (36.6 C) 97.6 F (36.4 C) 97.6 F (36.4  C)  TempSrc: Oral Oral Oral Oral  SpO2: 98% 97% 97% 100%  Weight:      Height:       General: alert, cooperative, and no distress Uterine Fundus: soft Incision: Dressing is clean, dry, and intact on left flank DVT Evaluation: No evidence of DVT seen on physical exam. Labs: Lab Results  Component Value Date   WBC 10.0 08/03/2020   HGB 9.2 (L) 08/03/2020   HCT 28.0 (L) 08/03/2020   MCV 84.8 08/03/2020   PLT 274 08/03/2020   CMP Latest Ref Rng & Units 07/31/2020  Glucose 70 - 99 mg/dL 08/02/2020)  BUN 6 - 20 mg/dL 277(A)  Creatinine <1(O - 1.00 mg/dL 8.78  Sodium 6.76 - 720 mmol/L 139  Potassium 3.5 - 5.1 mmol/L 3.6  Chloride 98 - 111 mmol/L 111  CO2 22 - 32 mmol/L 15(L)  Calcium 8.9 - 10.3 mg/dL 947)  Total Protein 6.5 - 8.1 g/dL 0.9(G)  Total Bilirubin 0.3 - 1.2 mg/dL 0.4  Alkaline Phos 38 - 126 U/L 72  AST 15 - 41 U/L 14(L)  ALT 0 - 44 U/L 12   Edinburgh Score: Edinburgh Postnatal Depression Scale Screening Tool 01/06/2018  I have been able to laugh and see the funny side of things. 0  I have looked forward with enjoyment to things. 0  I have blamed myself unnecessarily when things went wrong. 0  I have been anxious or worried for no good reason. 0  I have felt scared or panicky for no good reason. 0  Things have been getting on top of me. 0  I have been so unhappy that I have had difficulty sleeping. 0  I have felt sad or miserable. 0  I have been so unhappy that I have been crying. 0  The thought of harming myself has occurred to me. 0  Edinburgh Postnatal Depression Scale Total 0     After visit meds:  Allergies as of 08/03/2020   No Known Allergies      Medication List     TAKE these medications    ampicillin 500 MG capsule Commonly known as: PRINCIPEN Take 1 capsule (500 mg total) by mouth 4 (four) times daily.   cephALEXin 500 MG capsule Commonly known as: Keflex Take 1 capsule (500 mg total) by mouth daily.   docusate sodium 100 MG capsule Commonly  known as: COLACE Take 100 mg by mouth daily.   ferrous sulfate 325 (65 FE) MG EC tablet Take 325 mg by mouth every evening.   HYDROmorphone 2 MG tablet Commonly known as: DILAUDID Take 1 tablet (2 mg total) by mouth every 4 (four) hours as needed for severe pain.   levothyroxine 125 MCG tablet Commonly known as: SYNTHROID Take 125 mcg by mouth daily before breakfast.   ondansetron 4 MG tablet Commonly known as: Zofran Take 1 tablet (4 mg total) by mouth every 8 (eight) hours as needed for nausea or vomiting.   PNV-DHA PO Take 1 each by mouth daily.   tamsulosin 0.4 MG Caps capsule Commonly known as: FLOMAX Take 1 capsule (0.4 mg total) by mouth daily. What changed:  when to take this reasons to take this   Vitamin D 50 MCG (2000 UT) tablet Take 2,000 Units by mouth daily.        Follow up Visit: with urology on Wednesday and in our office on Wednesday as well   08/03/2020 Mitchel Honour, DO

## 2020-08-04 LAB — CULTURE, BLOOD (ROUTINE X 2)
Culture: NO GROWTH
Culture: NO GROWTH
Special Requests: ADEQUATE
Special Requests: ADEQUATE

## 2020-08-15 ENCOUNTER — Other Ambulatory Visit (HOSPITAL_COMMUNITY): Payer: Self-pay | Admitting: Interventional Radiology

## 2020-08-15 ENCOUNTER — Encounter (HOSPITAL_COMMUNITY): Payer: Self-pay

## 2020-08-15 ENCOUNTER — Telehealth (HOSPITAL_COMMUNITY): Payer: Self-pay | Admitting: *Deleted

## 2020-08-15 DIAGNOSIS — N200 Calculus of kidney: Secondary | ICD-10-CM

## 2020-08-15 DIAGNOSIS — O26833 Pregnancy related renal disease, third trimester: Secondary | ICD-10-CM

## 2020-08-15 NOTE — Patient Instructions (Addendum)
Robin Walker  08/15/2020   Your procedure is scheduled on:  08/24/2020  Arrive at 1100 at Entrance C on [address] at [facility]  and [employer]. You are invited to use the FREE valet parking or use the Visitor's parking deck.  Pick up the phone at the desk and dial [phone].  Call this number if you have problems the morning of surgery: [phone]  Remember:   Do not eat food:(After Midnight) Desps de medianoche.  Do not drink clear liquids: (After Midnight) Desps de medianoche.  Take these medicines the morning of surgery with A SIP OF WATER:  Take levothyroxine and keflex as prescribed   Do not wear jewelry, make-up or nail polish.  Do not wear lotions, powders, or perfumes. Do not wear deodorant.  Do not shave 48 hours prior to surgery.  Do not bring valuables to the hospital.  [facility] is not   responsible for any belongings or valuables brought to the hospital.  Contacts, dentures or bridgework may not be worn into surgery.  Leave suitcase in the car. After surgery it may be brought to your room.  For patients admitted to the hospital, checkout time is 11:00 AM the day of              discharge.      Please read over the following fact sheets that you were given:     Preparing for Surgery

## 2020-08-16 ENCOUNTER — Other Ambulatory Visit: Payer: Self-pay

## 2020-08-16 ENCOUNTER — Ambulatory Visit (HOSPITAL_COMMUNITY)
Admission: RE | Admit: 2020-08-16 | Discharge: 2020-08-16 | Disposition: A | Discharge status: Home / Self Care | Payer: BC Managed Care – PPO | Source: Ambulatory Visit | Attending: Interventional Radiology | Admitting: Interventional Radiology

## 2020-08-16 ENCOUNTER — Other Ambulatory Visit (HOSPITAL_COMMUNITY): Payer: Self-pay | Admitting: Interventional Radiology

## 2020-08-16 DIAGNOSIS — O26833 Pregnancy related renal disease, third trimester: Secondary | ICD-10-CM

## 2020-08-16 DIAGNOSIS — N200 Calculus of kidney: Secondary | ICD-10-CM

## 2020-08-16 HISTORY — PX: IR PATIENT EVAL TECH 0-60 MINS: IMG5564

## 2020-08-16 NOTE — Progress Notes (Signed)
Ms Carras here today for new urine collection bag for existing Percutaneous Nephrostomy tube.  Pt supplied with 2 bags. 

## 2020-08-17 ENCOUNTER — Ambulatory Visit (HOSPITAL_COMMUNITY): Payer: BC Managed Care – PPO

## 2020-08-21 ENCOUNTER — Encounter (HOSPITAL_COMMUNITY): Payer: Self-pay

## 2020-08-21 NOTE — Procedures (Signed)
Ms Ozawa here today for new urine collection bag for existing Percutaneous Nephrostomy tube.  Pt supplied with 2 bags.

## 2020-08-22 ENCOUNTER — Encounter (HOSPITAL_COMMUNITY)
Admission: RE | Admit: 2020-08-22 | Discharge: 2020-08-22 | Disposition: A | Discharge status: Home / Self Care | Payer: BC Managed Care – PPO | Source: Ambulatory Visit | Attending: Obstetrics and Gynecology | Admitting: Obstetrics and Gynecology

## 2020-08-22 ENCOUNTER — Other Ambulatory Visit: Payer: Self-pay

## 2020-08-22 ENCOUNTER — Encounter (HOSPITAL_COMMUNITY): Payer: Self-pay

## 2020-08-22 ENCOUNTER — Telehealth (HOSPITAL_COMMUNITY): Payer: Self-pay | Admitting: *Deleted

## 2020-08-22 ENCOUNTER — Other Ambulatory Visit (HOSPITAL_COMMUNITY)
Admission: RE | Admit: 2020-08-22 | Discharge: 2020-08-22 | Disposition: A | Discharge status: Home / Self Care | Payer: BC Managed Care – PPO | Source: Ambulatory Visit | Attending: Obstetrics and Gynecology | Admitting: Obstetrics and Gynecology

## 2020-08-22 DIAGNOSIS — Z20822 Contact with and (suspected) exposure to covid-19: Secondary | ICD-10-CM | POA: Insufficient documentation

## 2020-08-22 DIAGNOSIS — Z01812 Encounter for preprocedural laboratory examination: Secondary | ICD-10-CM | POA: Insufficient documentation

## 2020-08-22 LAB — CBC
HCT: 32.3 % — ABNORMAL LOW (ref 36.0–46.0)
Hemoglobin: 10.6 g/dL — ABNORMAL LOW (ref 12.0–15.0)
MCH: 28.2 pg (ref 26.0–34.0)
MCHC: 32.8 g/dL (ref 30.0–36.0)
MCV: 85.9 fL (ref 80.0–100.0)
Platelets: 313 10*3/uL (ref 150–400)
RBC: 3.76 MIL/uL — ABNORMAL LOW (ref 3.87–5.11)
RDW: 14.6 % (ref 11.5–15.5)
WBC: 9.9 10*3/uL (ref 4.0–10.5)
nRBC: 0 % (ref 0.0–0.2)

## 2020-08-22 LAB — SARS CORONAVIRUS 2 (TAT 6-24 HRS): SARS Coronavirus 2: NEGATIVE

## 2020-08-22 LAB — TYPE AND SCREEN
ABO/RH(D): O NEG
Antibody Screen: POSITIVE

## 2020-08-22 LAB — RPR: RPR Ser Ql: NONREACTIVE

## 2020-08-22 NOTE — Telephone Encounter (Signed)
Preadmission screen  

## 2020-08-22 NOTE — Telephone Encounter (Signed)
When patient arrived for her PAT she stated she has a left nephrostomy tube in place due to a kidney stone.  The nephrostomy bag will need to be changed while she is in the hospital.  She only has 1 nephrostomy bag left at home.  Asked her to message all label information and a picture if possible so we will have what she needs.  She states she was unable to find the bags anywhere and someone from [facility] had to bring a few to [facility] for her earlier.    She agreed to this plan and will send information when she gets home.

## 2020-08-23 ENCOUNTER — Telehealth (HOSPITAL_COMMUNITY): Payer: Self-pay | Admitting: *Deleted

## 2020-08-23 ENCOUNTER — Encounter (HOSPITAL_COMMUNITY): Payer: Self-pay | Admitting: Obstetrics and Gynecology

## 2020-08-23 NOTE — Telephone Encounter (Signed)
Informed pt that her nephrostomy bag has been ordered.  Discussed plan to ask MBU to contact [employer] regarding location of bag when it is needed.  Pt verbalized understanding.  All questions answered

## 2020-08-23 NOTE — H&P (Addendum)
Robin Walker is a 25 y.o. female presenting for repeat c/s.  Previous c/s and desires repeat Pregnancy complicated by persistent back and pelvic pain, ureter obstruction, Nephrostomy and 2 previous admissions for Pyelonephritis, and the urologist will retrieve the stone and remove the PCN after delivery.  MFM (Shakir) recommended delivery early term. I discussed this with Robin Walker and she wants to proceed. She acknowledges risks for fetal lung prematurity and had BMZ x 2 now.  US shows LGA with 4612 gm 97% on 08/23/20 OB History     Gravida  3   Para  1   Term  1   Preterm      AB  1   Living  1      SAB  1   IAB      Ectopic      Multiple  0   Live Births  1          Past Medical History:  Diagnosis Date   History of kidney stones    Hypothyroidism    PONV (postoperative nausea and vomiting)    Past Surgical History:  Procedure Laterality Date   CESAREAN SECTION N/A 01/06/2018   Procedure: CESAREAN SECTION;  Surgeon: Candice Camp, MD;  Location: [facility] BIRTHING SUIT[facility];  Service: Obstetrics;  Laterality: N/A;  Primary edc12/8 NKDA need RNFA   CHOLECYSTECTOMY     IR NEPHROSTOMY EXCHANGE LEFT  08/02/2020   IR NEPHROSTOMY PLACEMENT LEFT  06/23/2020   IR PATIENT EVAL TECH 0-60 MINS  08/16/2020   Family History: family history includes Diabetes in her father and mother; Heart disease in her father; Hypertension in her father. Social History:  reports that she has never smoked. She has never used smokeless tobacco. She reports previous alcohol use. She reports previous drug use.     Maternal Diabetes: No Genetic Screening: Normal Maternal Ultrasounds/Referrals: Normal Fetal Ultrasounds or other Referrals:  None Maternal Substance Abuse:  No Significant Maternal Medications:  Meds include: Other: keflex, dilaudid, synthroid, flexeril Significant Maternal Lab Results:  Group B Strep negative Other Comments:  None  Review of Systems History   unknown if  currently breastfeeding. Exam Physical Exam  Vitals and nursing note reviewed. Exam conducted with a chaperone present.  Constitutional:      Appearance: Normal appearance.  HENT:     Head: Normocephalic.  Eyes:     Pupils: Pupils are equal, round, and reactive to light.  Cardiovascular:     Rate and Rhythm: Normal rate and regular rhythm.     Pulses: Normal pulses.  Abdominal:     General: Abdomen is Gravid, nontender Neurological:     Mental Status: She is alert.  Left flank tender with PCN and dressing  Prenatal labs: ABO, Rh: --/--/O NEG (07/20 1006) Antibody: POS (07/20 1006) Rubella:   RPR: NON REACTIVE (07/20 1006)  HBsAg:    HIV:    GBS:     Assessment/Plan: IUP at term Previous c/s desires repeat Risks and benefits of C/S were discussed.  All questions were answered and informed consent was obtained.  Plan to proceed with low segment transverse Cesarean Section.  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  MFM and urology rec early term delivery   [name] 08/23/2020, 6:04 PM This patient has been seen and examined.   All of her questions were answered.  Labs and vital signs reviewed.  Informed consent has been obtained.  The History  and Physical is current. This patient has been seen and examined.   All of her questions were answered.  Labs and vital signs reviewed.  Informed consent has been obtained.  The History and Physical is current. DL

## 2020-08-24 ENCOUNTER — Inpatient Hospital Stay (HOSPITAL_COMMUNITY): Payer: BC Managed Care – PPO | Admitting: Anesthesiology

## 2020-08-24 ENCOUNTER — Inpatient Hospital Stay (HOSPITAL_COMMUNITY)
Admission: RE | Admit: 2020-08-24 | Discharge: 2020-08-26 | DRG: 787 | Disposition: A | Discharge status: Home / Self Care | Payer: BC Managed Care – PPO | Attending: Obstetrics and Gynecology | Admitting: Obstetrics and Gynecology

## 2020-08-24 ENCOUNTER — Encounter (HOSPITAL_COMMUNITY)
Admission: RE | Disposition: A | Discharge status: Home / Self Care | Payer: Self-pay | Source: Home / Self Care | Attending: Obstetrics and Gynecology

## 2020-08-24 ENCOUNTER — Other Ambulatory Visit: Payer: Self-pay

## 2020-08-24 ENCOUNTER — Encounter (HOSPITAL_COMMUNITY): Payer: Self-pay | Admitting: Obstetrics and Gynecology

## 2020-08-24 DIAGNOSIS — K5900 Constipation, unspecified: Secondary | ICD-10-CM | POA: Diagnosis present

## 2020-08-24 DIAGNOSIS — O99893 Other specified diseases and conditions complicating puerperium: Secondary | ICD-10-CM | POA: Diagnosis present

## 2020-08-24 DIAGNOSIS — Z98891 History of uterine scar from previous surgery: Secondary | ICD-10-CM

## 2020-08-24 DIAGNOSIS — O34211 Maternal care for low transverse scar from previous cesarean delivery: Principal | ICD-10-CM | POA: Diagnosis present

## 2020-08-24 DIAGNOSIS — O99214 Obesity complicating childbirth: Secondary | ICD-10-CM | POA: Diagnosis present

## 2020-08-24 DIAGNOSIS — O26833 Pregnancy related renal disease, third trimester: Secondary | ICD-10-CM | POA: Diagnosis present

## 2020-08-24 DIAGNOSIS — N202 Calculus of kidney with calculus of ureter: Secondary | ICD-10-CM | POA: Diagnosis present

## 2020-08-24 DIAGNOSIS — Z20822 Contact with and (suspected) exposure to covid-19: Secondary | ICD-10-CM | POA: Diagnosis present

## 2020-08-24 DIAGNOSIS — Z3A37 37 weeks gestation of pregnancy: Secondary | ICD-10-CM | POA: Diagnosis not present

## 2020-08-24 DIAGNOSIS — O99284 Endocrine, nutritional and metabolic diseases complicating childbirth: Secondary | ICD-10-CM | POA: Diagnosis present

## 2020-08-24 DIAGNOSIS — E039 Hypothyroidism, unspecified: Secondary | ICD-10-CM | POA: Diagnosis present

## 2020-08-24 SURGERY — Surgical Case
Anesthesia: Spinal

## 2020-08-24 MED ORDER — SOD CITRATE-CITRIC ACID 500-334 MG/5ML PO SOLN
ORAL | Status: AC
  Filled 2020-08-24: qty 30

## 2020-08-24 MED ORDER — PRENATAL MULTIVITAMIN CH
1.0000 | ORAL_TABLET | Freq: Every day | ORAL | Status: DC
  Administered 2020-08-25 – 2020-08-26 (×2): 1 via ORAL
  Filled 2020-08-24 (×2): qty 1

## 2020-08-24 MED ORDER — DIPHENHYDRAMINE HCL 25 MG PO CAPS: 25.0000 mg | ORAL_CAPSULE | ORAL | Status: DC | PRN

## 2020-08-24 MED ORDER — NALBUPHINE HCL 10 MG/ML IJ SOLN: 5.0000 mg | INTRAMUSCULAR | Status: DC | PRN

## 2020-08-24 MED ORDER — SODIUM CHLORIDE 0.9% FLUSH: 3.0000 mL | INTRAVENOUS | Status: DC | PRN

## 2020-08-24 MED ORDER — SCOPOLAMINE 1 MG/3DAYS TD PT72
1.0000 | MEDICATED_PATCH | Freq: Once | TRANSDERMAL | Status: DC
  Administered 2020-08-24: 1.5 mg via TRANSDERMAL

## 2020-08-24 MED ORDER — ACETAMINOPHEN 500 MG PO TABS
1000.0000 mg | ORAL_TABLET | Freq: Four times a day (QID) | ORAL | Status: AC
Start: 2020-08-24 — End: 2020-08-25
  Administered 2020-08-24 – 2020-08-25 (×4): 1000 mg via ORAL
  Filled 2020-08-24 (×4): qty 2

## 2020-08-24 MED ORDER — COCONUT OIL OIL
1.0000 "application " | TOPICAL_OIL | Status: DC | PRN
  Administered 2020-08-26: 1 via TOPICAL

## 2020-08-24 MED ORDER — FENTANYL CITRATE (PF) 100 MCG/2ML IJ SOLN
INTRAMUSCULAR | Status: DC | PRN
  Administered 2020-08-24: 15 ug via INTRATHECAL

## 2020-08-24 MED ORDER — DIBUCAINE (PERIANAL) 1 % EX OINT: 1.0000 "application " | TOPICAL_OINTMENT | CUTANEOUS | Status: DC | PRN

## 2020-08-24 MED ORDER — NALOXONE HCL 0.4 MG/ML IJ SOLN: 0.4000 mg | INTRAMUSCULAR | Status: DC | PRN

## 2020-08-24 MED ORDER — SOD CITRATE-CITRIC ACID 500-334 MG/5ML PO SOLN
30.0000 mL | Freq: Once | ORAL | Status: AC
  Administered 2020-08-24: 30 mL via ORAL

## 2020-08-24 MED ORDER — DIPHENHYDRAMINE HCL 25 MG PO CAPS: 25.0000 mg | ORAL_CAPSULE | Freq: Four times a day (QID) | ORAL | Status: DC | PRN

## 2020-08-24 MED ORDER — ONDANSETRON HCL 4 MG/2ML IJ SOLN
INTRAMUSCULAR | Status: AC
  Filled 2020-08-24: qty 2

## 2020-08-24 MED ORDER — MORPHINE SULFATE (PF) 0.5 MG/ML IJ SOLN
INTRAMUSCULAR | Status: DC | PRN
  Administered 2020-08-24: 150 ug via INTRATHECAL

## 2020-08-24 MED ORDER — OXYTOCIN-SODIUM CHLORIDE 30-0.9 UT/500ML-% IV SOLN
INTRAVENOUS | Status: AC
  Filled 2020-08-24: qty 500

## 2020-08-24 MED ORDER — DEXAMETHASONE SODIUM PHOSPHATE 10 MG/ML IJ SOLN
INTRAMUSCULAR | Status: AC
  Filled 2020-08-24: qty 1

## 2020-08-24 MED ORDER — ACETAMINOPHEN 325 MG PO TABS
650.0000 mg | ORAL_TABLET | ORAL | Status: DC | PRN
  Administered 2020-08-25 – 2020-08-26 (×4): 650 mg via ORAL
  Filled 2020-08-24 (×4): qty 2

## 2020-08-24 MED ORDER — IBUPROFEN 600 MG PO TABS
600.0000 mg | ORAL_TABLET | Freq: Four times a day (QID) | ORAL | Status: DC | PRN
  Administered 2020-08-25 – 2020-08-26 (×4): 600 mg via ORAL
  Filled 2020-08-24 (×4): qty 1

## 2020-08-24 MED ORDER — FENTANYL CITRATE (PF) 100 MCG/2ML IJ SOLN: 25.0000 ug | INTRAMUSCULAR | Status: DC | PRN

## 2020-08-24 MED ORDER — MORPHINE SULFATE (PF) 0.5 MG/ML IJ SOLN
INTRAMUSCULAR | Status: AC
  Filled 2020-08-24: qty 10

## 2020-08-24 MED ORDER — SCOPOLAMINE 1 MG/3DAYS TD PT72
MEDICATED_PATCH | TRANSDERMAL | Status: AC
  Filled 2020-08-24: qty 1

## 2020-08-24 MED ORDER — SODIUM CHLORIDE 0.9 % IV SOLN
INTRAVENOUS | Status: AC
  Filled 2020-08-24: qty 2

## 2020-08-24 MED ORDER — SENNOSIDES-DOCUSATE SODIUM 8.6-50 MG PO TABS
2.0000 | ORAL_TABLET | ORAL | Status: DC
  Administered 2020-08-25 – 2020-08-26 (×2): 2 via ORAL
  Filled 2020-08-24 (×2): qty 2

## 2020-08-24 MED ORDER — MEASLES, MUMPS & RUBELLA VAC IJ SOLR: 0.5000 mL | Freq: Once | INTRAMUSCULAR | Status: DC

## 2020-08-24 MED ORDER — DIPHENHYDRAMINE HCL 50 MG/ML IJ SOLN: 12.5000 mg | INTRAMUSCULAR | Status: DC | PRN

## 2020-08-24 MED ORDER — SODIUM CHLORIDE 0.9 % IR SOLN
Status: DC | PRN
  Administered 2020-08-24: 1000 mL

## 2020-08-24 MED ORDER — MEPERIDINE HCL 25 MG/ML IJ SOLN: 6.2500 mg | INTRAMUSCULAR | Status: DC | PRN

## 2020-08-24 MED ORDER — KETOROLAC TROMETHAMINE 30 MG/ML IJ SOLN: 30.0000 mg | Freq: Four times a day (QID) | INTRAMUSCULAR | Status: AC | PRN

## 2020-08-24 MED ORDER — OXYCODONE HCL 5 MG/5ML PO SOLN
5.0000 mg | Freq: Once | ORAL | Status: DC | PRN
Start: 2020-08-24 — End: 2020-08-24

## 2020-08-24 MED ORDER — DEXAMETHASONE SODIUM PHOSPHATE 10 MG/ML IJ SOLN
INTRAMUSCULAR | Status: DC | PRN
  Administered 2020-08-24: 10 mg via INTRAVENOUS

## 2020-08-24 MED ORDER — TETANUS-DIPHTH-ACELL PERTUSSIS 5-2.5-18.5 LF-MCG/0.5 IM SUSY: 0.5000 mL | PREFILLED_SYRINGE | Freq: Once | INTRAMUSCULAR | Status: DC

## 2020-08-24 MED ORDER — SODIUM CHLORIDE 0.9 % IV SOLN
2.0000 g | INTRAVENOUS | Status: AC
  Administered 2020-08-24: 2 g via INTRAVENOUS

## 2020-08-24 MED ORDER — LACTATED RINGERS IV SOLN: INTRAVENOUS | Status: DC

## 2020-08-24 MED ORDER — KETOROLAC TROMETHAMINE 30 MG/ML IJ SOLN
30.0000 mg | Freq: Four times a day (QID) | INTRAMUSCULAR | Status: AC | PRN
  Administered 2020-08-24 – 2020-08-25 (×2): 30 mg via INTRAVENOUS
  Filled 2020-08-24 (×2): qty 1

## 2020-08-24 MED ORDER — FENTANYL CITRATE (PF) 100 MCG/2ML IJ SOLN
INTRAMUSCULAR | Status: AC
  Filled 2020-08-24: qty 2

## 2020-08-24 MED ORDER — ONDANSETRON HCL 4 MG/2ML IJ SOLN: 4.0000 mg | Freq: Three times a day (TID) | INTRAMUSCULAR | Status: DC | PRN

## 2020-08-24 MED ORDER — PROMETHAZINE HCL 25 MG/ML IJ SOLN: 6.2500 mg | INTRAMUSCULAR | Status: DC | PRN

## 2020-08-24 MED ORDER — MEPERIDINE HCL 25 MG/ML IJ SOLN
INTRAMUSCULAR | Status: DC | PRN
  Administered 2020-08-24 (×2): 12.5 mg via INTRAVENOUS

## 2020-08-24 MED ORDER — OXYCODONE-ACETAMINOPHEN 5-325 MG PO TABS: 1.0000 | ORAL_TABLET | ORAL | Status: DC | PRN

## 2020-08-24 MED ORDER — NALBUPHINE HCL 10 MG/ML IJ SOLN: 5.0000 mg | Freq: Once | INTRAMUSCULAR | Status: DC | PRN

## 2020-08-24 MED ORDER — NALBUPHINE HCL 10 MG/ML IJ SOLN
5.0000 mg | Freq: Once | INTRAMUSCULAR | Status: DC | PRN
Start: 2020-08-24 — End: 2020-08-26

## 2020-08-24 MED ORDER — LEVOTHYROXINE SODIUM 75 MCG PO TABS
125.0000 ug | ORAL_TABLET | Freq: Every day | ORAL | Status: DC
  Administered 2020-08-25 – 2020-08-26 (×2): 125 ug via ORAL
  Filled 2020-08-24 (×2): qty 1

## 2020-08-24 MED ORDER — BUPIVACAINE IN DEXTROSE 0.75-8.25 % IT SOLN
INTRATHECAL | Status: DC | PRN
  Administered 2020-08-24: 1.6 mg via INTRATHECAL

## 2020-08-24 MED ORDER — OXYTOCIN-SODIUM CHLORIDE 30-0.9 UT/500ML-% IV SOLN
INTRAVENOUS | Status: DC | PRN
  Administered 2020-08-24: 300 mL via INTRAVENOUS

## 2020-08-24 MED ORDER — PHENYLEPHRINE HCL-NACL 20-0.9 MG/250ML-% IV SOLN
INTRAVENOUS | Status: AC
  Filled 2020-08-24: qty 250

## 2020-08-24 MED ORDER — SIMETHICONE 80 MG PO CHEW: 80.0000 mg | CHEWABLE_TABLET | ORAL | Status: DC | PRN

## 2020-08-24 MED ORDER — OXYTOCIN-SODIUM CHLORIDE 30-0.9 UT/500ML-% IV SOLN: 2.5000 [IU]/h | INTRAVENOUS | Status: AC

## 2020-08-24 MED ORDER — ONDANSETRON HCL 4 MG/2ML IJ SOLN
INTRAMUSCULAR | Status: DC | PRN
  Administered 2020-08-24: 4 mg via INTRAVENOUS

## 2020-08-24 MED ORDER — SIMETHICONE 80 MG PO CHEW
80.0000 mg | CHEWABLE_TABLET | Freq: Three times a day (TID) | ORAL | Status: DC
  Administered 2020-08-24 – 2020-08-26 (×4): 80 mg via ORAL
  Filled 2020-08-24 (×5): qty 1

## 2020-08-24 MED ORDER — MENTHOL 3 MG MT LOZG: 1.0000 | LOZENGE | OROMUCOSAL | Status: DC | PRN

## 2020-08-24 MED ORDER — ZOLPIDEM TARTRATE 5 MG PO TABS: 5.0000 mg | ORAL_TABLET | Freq: Every evening | ORAL | Status: DC | PRN

## 2020-08-24 MED ORDER — OXYCODONE HCL 5 MG PO TABS: 5.0000 mg | ORAL_TABLET | Freq: Once | ORAL | Status: DC | PRN

## 2020-08-24 MED ORDER — CEPHALEXIN 500 MG PO CAPS
500.0000 mg | ORAL_CAPSULE | Freq: Every day | ORAL | Status: DC
  Administered 2020-08-25 – 2020-08-26 (×2): 500 mg via ORAL
  Filled 2020-08-24 (×2): qty 1

## 2020-08-24 MED ORDER — MEPERIDINE HCL 25 MG/ML IJ SOLN
INTRAMUSCULAR | Status: AC
  Filled 2020-08-24: qty 1

## 2020-08-24 MED ORDER — NALOXONE HCL 4 MG/10ML IJ SOLN
1.0000 ug/kg/h | INTRAVENOUS | Status: DC | PRN
  Filled 2020-08-24: qty 5

## 2020-08-24 MED ORDER — ACETAMINOPHEN 10 MG/ML IV SOLN: 1000.0000 mg | Freq: Once | INTRAVENOUS | Status: DC | PRN

## 2020-08-24 MED ORDER — PHENYLEPHRINE HCL-NACL 20-0.9 MG/250ML-% IV SOLN
INTRAVENOUS | Status: DC | PRN
  Administered 2020-08-24: 90 ug/min via INTRAVENOUS
  Administered 2020-08-24: 105 ug/min via INTRAVENOUS
  Administered 2020-08-24: 60 ug/min via INTRAVENOUS

## 2020-08-24 MED ORDER — POVIDONE-IODINE 10 % EX SWAB
2.0000 "application " | Freq: Once | CUTANEOUS | Status: AC
  Administered 2020-08-24: 2 via TOPICAL

## 2020-08-24 MED ORDER — WITCH HAZEL-GLYCERIN EX PADS: 1.0000 "application " | MEDICATED_PAD | CUTANEOUS | Status: DC | PRN

## 2020-08-24 SURGICAL SUPPLY — 30 items
CHLORAPREP W/TINT 26ML (MISCELLANEOUS) ×2 IMPLANT
CLAMP CORD UMBIL (MISCELLANEOUS) IMPLANT
CLIP FILSHIE TUBAL LIGA STRL (Clip) IMPLANT
CLOTH BEACON ORANGE TIMEOUT ST (SAFETY) ×2 IMPLANT
DRSG OPSITE POSTOP 4X10 (GAUZE/BANDAGES/DRESSINGS) ×2 IMPLANT
ELECT REM PT RETURN 9FT ADLT (ELECTROSURGICAL) ×2
ELECTRODE REM PT RTRN 9FT ADLT (ELECTROSURGICAL) ×1 IMPLANT
EXTRACTOR VACUUM M CUP 4 TUBE (SUCTIONS) IMPLANT
GLOVE BIOGEL PI IND STRL 7.0 (GLOVE) ×1 IMPLANT
GLOVE BIOGEL PI INDICATOR 7.0 (GLOVE) ×1
GLOVE SURG ORTHO 8.0 STRL STRW (GLOVE) ×2 IMPLANT
GOWN STRL REUS W/TWL LRG LVL3 (GOWN DISPOSABLE) ×4 IMPLANT
KIT ABG SYR 3ML LUER SLIP (SYRINGE) ×2 IMPLANT
NEEDLE HYPO 25X5/8 SAFETYGLIDE (NEEDLE) ×2 IMPLANT
NS IRRIG 1000ML POUR BTL (IV SOLUTION) ×2 IMPLANT
PACK C SECTION WH (CUSTOM PROCEDURE TRAY) ×2 IMPLANT
PAD OB MATERNITY 4.3X12.25 (PERSONAL CARE ITEMS) ×2 IMPLANT
PENCIL SMOKE EVAC W/HOLSTER (ELECTROSURGICAL) ×2 IMPLANT
RETRACTOR TRAXI PANNICULUS (MISCELLANEOUS) ×1 IMPLANT
SUT MNCRL 0 VIOLET CTX 36 (SUTURE) ×3 IMPLANT
SUT MON AB 4-0 PS1 27 (SUTURE) ×2 IMPLANT
SUT MONOCRYL 0 CTX 36 (SUTURE) ×3
SUT PDS AB 1 CT  36 (SUTURE)
SUT PDS AB 1 CT 36 (SUTURE) IMPLANT
SUT VIC AB 1 CTX 36 (SUTURE)
SUT VIC AB 1 CTX36XBRD ANBCTRL (SUTURE) IMPLANT
TOWEL OR 17X24 6PK STRL BLUE (TOWEL DISPOSABLE) ×2 IMPLANT
TRAXI PANNICULUS RETRACTOR (MISCELLANEOUS) ×1
TRAY FOLEY W/BAG SLVR 14FR LF (SET/KITS/TRAYS/PACK) ×2 IMPLANT
WATER STERILE IRR 1000ML POUR (IV SOLUTION) ×2 IMPLANT

## 2020-08-24 NOTE — Anesthesia Preprocedure Evaluation (Addendum)
Anesthesia Evaluation  Patient identified by MRN, date of birth, ID band Patient awake    Reviewed: Allergy & Precautions, H&P , NPO status , Patient's Chart, lab work & pertinent test results  History of Anesthesia Complications (+) PONV and history of anesthetic complications  Airway Mallampati: II  TM Distance: >3 FB Neck ROM: Full    Dental no notable dental hx.    Pulmonary neg pulmonary ROS,    Pulmonary exam normal breath sounds clear to auscultation       Cardiovascular Exercise Tolerance: Good negative cardio ROS Normal cardiovascular exam Rhythm:Regular Rate:Normal     Neuro/Psych negative neurological ROS  negative psych ROS   GI/Hepatic Neg liver ROS, + constipation   Endo/Other  negative endocrine ROSHypothyroidism Morbid obesity  Renal/GU Renal disease (kidney stones)   Obstructed ureter with nephrostomy tube/bag in place    Musculoskeletal   Abdominal   Peds negative pediatric ROS (+)  Hematology negative hematology ROS (+)   Anesthesia Other Findings + left sided nephrostomy tube  Reproductive/Obstetrics (+) Pregnancy                           Anesthesia Physical  Anesthesia Plan  ASA: 4  Anesthesia Plan: Spinal   Post-op Pain Management:    Induction:   PONV Risk Score and Plan: 3 and Treatment may vary due to age or medical condition, Scopolamine patch - Pre-op and Ondansetron  Airway Management Planned: Natural Airway  Additional Equipment: None  Intra-op Plan:   Post-operative Plan:   Informed Consent: I have reviewed the patients History and Physical, chart, labs and discussed the procedure including the risks, benefits and alternatives for the proposed anesthesia with the patient or authorized representative who has indicated his/her understanding and acceptance.     Dental advisory given  Plan Discussed with: CRNA, Anesthesiologist and  Surgeon  Anesthesia Plan Comments: (Spinal. GETA as backup)      Anesthesia Quick Evaluation

## 2020-08-24 NOTE — Op Note (Signed)
Cesarean Section Procedure Note  Pre-operative Diagnosis: IUP at 37 weeks, ureteral obstruction with nephrostomy in place, previous c/s desires repeat, macrosomia  Post-operative Diagnosis: same  Surgeon: [name]   Assistants: Dr Debroah Loop  Anesthesia: spinal  Procedure:  Low Segment Transverse cesarean section  Procedure Details  The patient was seen in the Holding Room. The risks, benefits, complications, treatment options, and expected outcomes were discussed with the patient.  The patient concurred with the proposed plan, giving informed consent.  The site of surgery properly noted/marked.. A Time Out was held and the above information confirmed.  After induction of anesthesia, the patient was draped and prepped in the usual sterile manner. A Pfannenstiel incision was made and carried down through the subcutaneous tissue to the fascia. Fascial incision was made and extended transversely. The fascia was separated from the underlying rectus tissue superiorly and inferiorly. The peritoneum was identified and entered. Peritoneal incision was extended longitudinally. The utero-vesical peritoneal reflection was incised transversely and the bladder flap was bluntly freed from the lower uterine segment. A low transverse uterine incision was made. Delivered from vertex presentation with one pull VTE was a baby with Apgar scores of 9 at one minute and 9 at five minutes. After the umbilical cord was clamped and cut cord blood was obtained for evaluation. The placenta was removed intact and appeared normal. The uterine outline, tubes and ovaries appeared normal. The uterine incision was closed with running locked sutures of 0 monocryl and imbricated with 0 monocryl. Hemostasis was observed. Lavage was carried out until clear. The peritoneum was then closed with 0 monocryl and rectus muscles plicated in the midline.  After hemostasis was assured, the fascia was then reapproximated with running sutures of 0  PDS loop. Irrigation was applied and after adequate hemostasis was assured, the skin was reapproximated with subcutaneous sutures using 4-0 monocryl.  Instrument, sponge, and needle counts were correct prior the abdominal closure and at the conclusion of the case. The patient received 2 grams cefotetan preoperatively.  Findings: Viable female  Estimated Blood Loss:  350cc         Specimens: Placenta was sent to labor and delivery         Complications:  None

## 2020-08-24 NOTE — Transfer of Care (Signed)
Immediate Anesthesia Transfer of Care Note  Patient: Robin Walker  Procedure(s) Performed: REPEAT CESAREAN SECTION EDC: 09-13-20 ALLERG: NKDA  PREVIOUS X 1  Patient Location: PACU  Anesthesia Type:Spinal  Level of Consciousness: awake  Airway & Oxygen Therapy: Patient Spontanous Breathing  Post-op Assessment: Report given to RN  Post vital signs: Reviewed and stable  Last Vitals:  Vitals Value Taken Time  BP 130/59 08/24/20 1532  Temp 36.7 C 08/24/20 1532  Pulse 63 08/24/20 1532  Resp 18 08/24/20 1532  SpO2 99 % 08/24/20 1447  Vitals shown include unvalidated device data.  Last Pain:  Vitals:   08/24/20 1533  TempSrc:   PainSc: 0-No pain         Complications: No notable events documented.

## 2020-08-24 NOTE — Anesthesia Postprocedure Evaluation (Signed)
Anesthesia Post Note  Patient: Robin Walker  Procedure(s) Performed: REPEAT CESAREAN SECTION EDC: 09-13-20 ALLERG: NKDA  PREVIOUS X 1     Patient location during evaluation: Mother Baby Anesthesia Type: Spinal Level of consciousness: oriented and awake and alert Pain management: pain level controlled Vital Signs Assessment: post-procedure vital signs reviewed and stable Respiratory status: spontaneous breathing and respiratory function stable Cardiovascular status: blood pressure returned to baseline and stable Postop Assessment: no headache, no backache, no apparent nausea or vomiting and able to ambulate Anesthetic complications: no   No notable events documented.  Last Vitals:  Vitals:   08/24/20 1445 08/24/20 1532  BP: 111/65 (!) 130/59  Pulse: 68 63  Resp: 20 18  Temp: 36.6 C 36.7 C  SpO2: 98%     Last Pain:  Vitals:   08/24/20 1533  TempSrc:   PainSc: 0-No pain   Pain Goal:                   Mellody Dance

## 2020-08-24 NOTE — Anesthesia Procedure Notes (Signed)
Spinal  Patient location during procedure: OR Start time: 08/24/2020 12:20 PM End time: 08/24/2020 12:25 PM Reason for block: surgical anesthesia Staffing Performed: anesthesiologist  Anesthesiologist: Mellody Dance, MD Preanesthetic Checklist Completed: patient identified, IV checked, risks and benefits discussed, surgical consent, monitors and equipment checked, pre-op evaluation and timeout performed Spinal Block Patient position: sitting Prep: DuraPrep Patient monitoring: cardiac monitor, continuous pulse ox and blood pressure Approach: midline Location: L3-4 Injection technique: single-shot Needle Needle type: Pencan  Needle gauge: 24 G Needle length: 9 cm Assessment Sensory level: T4 Events: CSF return Additional Notes Functioning IV was confirmed and monitors were applied. Sterile prep and drape, including hand hygiene and sterile gloves were used. The patient was positioned and the spine was prepped. The skin was anesthetized with lidocaine.  Free flow of clear CSF was obtained prior to injecting local anesthetic into the CSF.  The spinal needle aspirated freely following injection.  The needle was carefully withdrawn.  The patient tolerated the procedure well.

## 2020-08-25 LAB — CBC
HCT: 28.1 % — ABNORMAL LOW (ref 36.0–46.0)
Hemoglobin: 9.3 g/dL — ABNORMAL LOW (ref 12.0–15.0)
MCH: 28.3 pg (ref 26.0–34.0)
MCHC: 33.1 g/dL (ref 30.0–36.0)
MCV: 85.4 fL (ref 80.0–100.0)
Platelets: 267 10*3/uL (ref 150–400)
RBC: 3.29 MIL/uL — ABNORMAL LOW (ref 3.87–5.11)
RDW: 14.5 % (ref 11.5–15.5)
WBC: 11.9 10*3/uL — ABNORMAL HIGH (ref 4.0–10.5)
nRBC: 0 % (ref 0.0–0.2)

## 2020-08-25 MED ORDER — POLYETHYLENE GLYCOL 3350 17 G PO PACK
17.0000 g | PACK | Freq: Every day | ORAL | Status: DC
  Administered 2020-08-25 – 2020-08-26 (×2): 17 g via ORAL
  Filled 2020-08-25 (×2): qty 1

## 2020-08-25 MED ORDER — RHO D IMMUNE GLOBULIN 1500 UNIT/2ML IJ SOSY
300.0000 ug | PREFILLED_SYRINGE | Freq: Once | INTRAMUSCULAR | Status: AC
  Administered 2020-08-25: 300 ug via INTRAVENOUS
  Filled 2020-08-25: qty 2

## 2020-08-25 NOTE — Progress Notes (Signed)
Subjective: Postpartum Day 1: Cesarean Delivery Patient reports tolerating PO and no problems voiding.    Objective: Vital signs in last 24 hours: Temp:  [97.9 F (36.6 C)-99.4 F (37.4 C)] 98.8 F (37.1 C) (07/23 6761) Pulse Rate:  [63-82] 76 (07/23 0642) Resp:  [12-20] 19 (07/23 0642) BP: (105-130)/(51-69) 105/57 (07/23 0642) SpO2:  [96 %-100 %] 99 % (07/23 0642) Weight:  [124.3 kg] 124.3 kg (07/22 1116)  Physical Exam:  General: alert, cooperative, appears stated age, and no distress Lochia: appropriate Uterine Fundus: firm Incision: healing well DVT Evaluation: No evidence of DVT seen on physical exam.  Recent Labs    08/22/20 1006 08/25/20 0403  HGB 10.6* 9.3*  HCT 32.3* 28.1*    Assessment/Plan: Status post Cesarean section. Doing well postoperatively.  Continue current care Circumcision today.  [name] 08/25/2020, 9:31 AM

## 2020-08-26 LAB — RH IG WORKUP (INCLUDES ABO/RH)
Fetal Screen: NEGATIVE
Gestational Age(Wks): 37
Unit division: 0

## 2020-08-26 MED ORDER — IBUPROFEN 600 MG PO TABS: 600.0000 mg | ORAL_TABLET | Freq: Four times a day (QID) | ORAL | 0 refills | Status: DC | PRN

## 2020-08-26 MED ORDER — OXYCODONE-ACETAMINOPHEN 5-325 MG PO TABS: 1.0000 | ORAL_TABLET | ORAL | 0 refills | Status: DC | PRN

## 2020-08-26 NOTE — Discharge Summary (Signed)
Postpartum Discharge Summary       Patient Name: Robin Walker DOB: [birth year] MRN: [id number]  Date of admission: 08/24/2020 Delivery date:08/24/2020  Delivering provider: Louretta Shorten  Date of discharge: 08/26/2020  Admitting diagnosis: Previous cesarean section [Z98.891] Cesarean delivery delivered [O82] Intrauterine pregnancy: [redacted]w[redacted]d    Secondary diagnosis:  Active Problems:   Cesarean delivery delivered   Previous cesarean section  Additional problems: ureteral obstruction, nephrolithiasis, nephrostomy in place, pain    Discharge diagnosis: ureteral obstruction, nephrolithiasis, nephrostomy in place, cesarean section and term delivery                                              Post partum procedures:   Augmentation: N/A Complications: None  Hospital course: Sceduled C/S   25y.o. yo [id number] admitted to the hospital 08/24/2020 for scheduled cesarean section with the following indication:Elective Repeat.Delivery details are as follows:  Membrane Rupture Time/Date: 12:55 PM ,08/24/2020   Delivery Method:C-Section, Vacuum Assisted  Details of operation can be found in separate operative note.  Patient had an uncomplicated postpartum course.  She is ambulating, tolerating a regular diet, passing flatus, and urinating well. Patient is discharged home in stable condition on  08/26/20        Newborn Data: Birth date:08/24/2020  Birth time:12:56 PM  Gender:Female  Living status:Living  Apgars:9 ,9  Weight:5035 g     Magnesium Sulfate received: No BMZ received: Yes Rhophylac:N/A MMR:N/A T-DaP:Given prenatally Flu: Yes Transfusion:No  Physical exam  Vitals:   08/25/20 0254 08/25/20 0642 08/25/20 2238 08/26/20 0553  BP: 120/60 (!) 105/57 126/69 119/62  Pulse: 71 76 85 78  Resp: '20 19 18 18  ' Temp: 98.6 F (37 C) 98.8 F (37.1 C) 98.2 F (36.8 C) 98.1 F (36.7 C)  TempSrc: Oral Oral Oral Oral  SpO2: 98% 99% 98% 99%  Weight:      Height:        General: alert, cooperative, and no distress Lochia: appropriate Uterine Fundus: firm Incision: Healing well with no significant drainage DVT Evaluation: No evidence of DVT seen on physical exam. Labs: Lab Results  Component Value Date   WBC 11.9 (H) 08/25/2020   HGB 9.3 (L) 08/25/2020   HCT 28.1 (L) 08/25/2020   MCV 85.4 08/25/2020   PLT 267 08/25/2020   CMP Latest Ref Rng & Units 07/31/2020  Glucose 70 - 99 mg/dL 164(H)  BUN 6 - 20 mg/dL <5(L)  Creatinine 0.44 - 1.00 mg/dL 0.76  Sodium 135 - 145 mmol/L 139  Potassium 3.5 - 5.1 mmol/L 3.6  Chloride 98 - 111 mmol/L 111  CO2 22 - 32 mmol/L 15(L)  Calcium 8.9 - 10.3 mg/dL 8.6(L)  Total Protein 6.5 - 8.1 g/dL 5.5(L)  Total Bilirubin 0.3 - 1.2 mg/dL 0.4  Alkaline Phos 38 - 126 U/L 72  AST 15 - 41 U/L 14(L)  ALT 0 - 44 U/L 12   Edinburgh Score: Edinburgh Postnatal Depression Scale Screening Tool 08/24/2020  I have been able to laugh and see the funny side of things. 0  I have looked forward with enjoyment to things. 0  I have blamed myself unnecessarily when things went wrong. 1  I have been anxious or worried for no good reason. 1  I have felt scared or panicky for no good reason. 0  Things have been  getting on top of me. 0  I have been so unhappy that I have had difficulty sleeping. 0  I have felt sad or miserable. 0  I have been so unhappy that I have been crying. 0  The thought of harming myself has occurred to me. 0  Edinburgh Postnatal Depression Scale Total 2      After visit meds:  Allergies as of 08/26/2020   No Known Allergies      Medication List     STOP taking these medications    ampicillin 500 MG capsule Commonly known as: PRINCIPEN   HYDROmorphone 2 MG tablet Commonly known as: DILAUDID   tamsulosin 0.4 MG Caps capsule Commonly known as: FLOMAX       TAKE these medications    cephALEXin 500 MG capsule Commonly known as: Keflex Take 1 capsule (500 mg total) by mouth daily.    docusate sodium 100 MG capsule Commonly known as: COLACE Take 100 mg by mouth daily.   ferrous sulfate 325 (65 FE) MG EC tablet Take 325 mg by mouth every evening.   ibuprofen 600 MG tablet Commonly known as: ADVIL Take 1 tablet (600 mg total) by mouth every 6 (six) hours as needed for cramping, moderate pain or mild pain.   levothyroxine 125 MCG tablet Commonly known as: SYNTHROID Take 125 mcg by mouth daily before breakfast.   ondansetron 4 MG tablet Commonly known as: Zofran Take 1 tablet (4 mg total) by mouth every 8 (eight) hours as needed for nausea or vomiting.   oxyCODONE-acetaminophen 5-325 MG tablet Commonly known as: PERCOCET/ROXICET Take 1-2 tablets by mouth every 4 (four) hours as needed for moderate pain.   PNV-DHA PO Take 1 each by mouth daily.   Vitamin D 50 MCG (2000 UT) tablet Take 2,000 Units by mouth daily.         Discharge home in stable condition Infant Feeding: Breast Infant Disposition:home with mother Discharge instruction: per After Visit Summary and Postpartum booklet. Activity: Advance as tolerated. Pelvic rest for 6 weeks.  Diet: routine diet Anticipated Birth Control: Unsure Postpartum Appointment:6 weeks Additional Postpartum F/U:  urology Future Appointments:No future appointments. Follow up Visit:      08/26/2020 Luz Lex, MD

## 2020-08-26 NOTE — Progress Notes (Signed)
Pt c/o severe constipation. States there is a hard stool that she is not able to pass. Had miralax during the daytime and senokot. Pt requesting something else for constipation. Bowel sounds auscultated hypoactive and faint, pt reports she isn't really able to pass any gas either. Dr. Rana Snare called and order received for soap suds enema.

## 2020-08-26 NOTE — Lactation Note (Signed)
This note was copied from a baby's chart. Lactation Consultation Note  Patient Name: Robin Walker Today's Date: 08/26/2020 Reason for consult: Follow-up assessment;Maternal endocrine disorder Age:27 hours   Returned to room to fit mother's pump flanges. Size 30 mm was the most comfortable. Provided mother with coconut oil. Observed pumping and mother's milk is transitioning with good flow.   Feeding Mother's Current Feeding Choice: Breast Milk and Donor Milk  LATCH Score Latch: Grasps breast easily, tongue down, lips flanged, rhythmical sucking. (latched upon entering)  Audible Swallowing: A few with stimulation  Type of Nipple: Everted at rest and after stimulation  Comfort (Breast/Nipple): Soft / non-tender  Hold (Positioning): Assistance needed to correctly position infant at breast and maintain latch.  LATCH Score: 8   Lactation Tools Discussed/Used  Flanges, manual pump Mother has [employer] personal DEBP at home.   Interventions Interventions: Breast feeding basics reviewed;Position options;Support pillows;Education  Discharge Discharge Education: Engorgement and breast care;Warning signs for feeding baby  Consult Status Consult Status: Follow-up Date: 08/26/20 Follow-up type: In-patient    Dahlia Byes [facility] 08/26/2020, 11:47 AM

## 2020-08-26 NOTE — Lactation Note (Signed)
This note was copied from a baby's chart. Lactation Consultation Note  Patient Name: Robin Walker Today's Date: 08/26/2020 Reason for consult: Follow-up assessment Age:27 hours  P2, Baby latched upon entering.  Encouraged mother to provided support to baby.  Mother would like flange size checked for pump.  LC will return when mother calls. Reviewed basics.  Feed on demand with cues.  Goal 8-12+ times per day after first 24 hrs.  Place baby STS if not cueing.  Mom made aware of O/P services, breastfeeding support groupsand our phone # for post-discharge questions.      Feeding Mother's Current Feeding Choice: Breast Milk and Donor Milk  LATCH Score Latch: Grasps breast easily, tongue down, lips flanged, rhythmical sucking. (latched upon entering)  Audible Swallowing: A few with stimulation  Type of Nipple: Everted at rest and after stimulation  Comfort (Breast/Nipple): Soft / non-tender  Hold (Positioning): Assistance needed to correctly position infant at breast and maintain latch.  LATCH Score: 8   Lactation Tools Discussed/Used    Interventions Interventions: Breast feeding basics reviewed;Position options;Support pillows;Education  Discharge Discharge Education: Engorgement and breast care;Warning signs for feeding baby  Consult Status Consult Status: Follow-up Date: 08/26/20 Follow-up type: In-patient    Dahlia Byes [facility] 08/26/2020, 11:05 AM

## 2020-08-26 NOTE — Progress Notes (Signed)
Subjective: Postpartum Day 2: Cesarean Delivery Patient reports tolerating PO, + flatus, and no problems voiding.   Constipation somewhat relieved with S&S enema.  No benefit with miralax  Objective: Vital signs in last 24 hours: Temp:  [98.1 F (36.7 C)-98.2 F (36.8 C)] 98.1 F (36.7 C) (07/24 0553) Pulse Rate:  [78-85] 78 (07/24 0553) Resp:  [18] 18 (07/24 0553) BP: (119-126)/(62-69) 119/62 (07/24 0553) SpO2:  [98 %-99 %] 99 % (07/24 0553)  Physical Exam:  General: alert, cooperative, appears stated age, and no distress Lochia: appropriate Uterine Fundus: firm Incision: healing well DVT Evaluation: No evidence of DVT seen on physical exam.  Recent Labs    08/25/20 0403  HGB 9.3*  HCT 28.1*    Assessment/Plan: Status post Cesarean section. Doing well postoperatively.  Continue current care.  [name] 08/26/2020, 8:10 AM

## 2020-09-05 ENCOUNTER — Telehealth (HOSPITAL_COMMUNITY): Payer: Self-pay | Admitting: *Deleted

## 2020-09-05 NOTE — Telephone Encounter (Signed)
Patient voiced no questions or concerns regarding her own health at this time. EPDS = 3. Patient voiced no questions or concerns regarding baby at this time. Patient reported infant sleeps in a bassinet on his back. RN reviewed ABCs of safe sleep - patient verbalized understanding. Patient request RN email information on the hospital's virtual breastfeeding and pumping support groups. Email sent. Deforest Hoyles, RN, 09/05/20, 1911.

## 2020-09-13 ENCOUNTER — Other Ambulatory Visit: Payer: Self-pay

## 2020-09-13 ENCOUNTER — Other Ambulatory Visit (HOSPITAL_COMMUNITY): Payer: Self-pay | Admitting: Urology

## 2020-09-13 ENCOUNTER — Ambulatory Visit (HOSPITAL_COMMUNITY)
Admission: RE | Admit: 2020-09-13 | Discharge: 2020-09-13 | Disposition: A | Discharge status: Home / Self Care | Payer: BC Managed Care – PPO | Source: Ambulatory Visit | Attending: Urology | Admitting: Urology

## 2020-09-13 DIAGNOSIS — Z436 Encounter for attention to other artificial openings of urinary tract: Secondary | ICD-10-CM | POA: Insufficient documentation

## 2020-09-13 DIAGNOSIS — R109 Unspecified abdominal pain: Secondary | ICD-10-CM

## 2020-09-13 DIAGNOSIS — N200 Calculus of kidney: Secondary | ICD-10-CM | POA: Diagnosis not present

## 2020-09-13 HISTORY — PX: IR NEPHROSTOGRAM LEFT THRU EXISTING ACCESS: IMG6061

## 2020-09-13 HISTORY — PX: IR NEPHRO TUBE REMOV/FL: IMG2342

## 2020-09-13 MED ORDER — IOHEXOL 300 MG/ML  SOLN
50.0000 mL | Freq: Once | INTRAMUSCULAR | Status: AC | PRN
  Administered 2020-09-13: 10 mL

## 2020-09-13 NOTE — Procedures (Signed)
Interventional Radiology Procedure:   Indications: Urinary stones and left nephrostomy tube placed during pregnancy  Procedure: Left nephrostogram and tube removal  Findings: Rapid drainage into bladder.  Nephrostomy was removed.  Complications: No immediate complications noted.     EBL: Minimal  Plan: Change bandage as needed.    Smriti Barkow R. Lowella Dandy, MD  Pager: [phone]

## 2020-11-26 ENCOUNTER — Other Ambulatory Visit: Payer: Self-pay | Admitting: Urology

## 2020-11-26 DIAGNOSIS — N200 Calculus of kidney: Secondary | ICD-10-CM

## 2020-11-27 NOTE — Progress Notes (Signed)
Patient to arrive at 0915 on 11/29/2020. History and medications reviewed. Pre-procedure instructions given. NPO after MN on Wednesday except for clear liquids until 0715. Driver secured.

## 2020-11-28 NOTE — H&P (Signed)
Office Visit Report     11/23/2020   --------------------------------------------------------------------------------   Robin Walker  MRN: [id number]  DOB: 09/14/1995, 25 year old Female  SSN:    PRIMARY CARE:    REFERRING:  Dineen Kid. Rana Snare, MD  PROVIDER:  Karoline Caldwell, M.D.  LOCATION:  [facility] [phone]     --------------------------------------------------------------------------------   CC/HPI: cc: Bilateral obstructing calculi   HPI: Robin Walker is a 25 year old female who was seen in the emergency department by Dr. new some due to bilateral obstructing stones. At this time, she was [redacted] weeks pregnant and a left-sided nephrostomy tube was advised. She presents today for follow-up. She reports that since her emergency room visit she has passed a stone. She also has gotten her left-sided nephrostomy tube which is draining clear urine and she is feeling much improved. She denies any interval fevers, chills, urinary tract infections. She is currently [redacted] weeks pregnant. She has an upcoming appointment for nephrostomy tube change on July 15th.   Interval: She was treated for urinary tract infection associated sepsis in the hospital after developing a fever and chills. She presents today for right renal ultrasound. Today she is feeling fatigued.  -09/03/20-patient with history of bilateral ureteral calculi and intrauterine pregnancy. Ultimately underwent left-sided nephrostomy tube for a proximal 8 mm ureteral calculus and treated with tamsulosin for the 4 mm distal ureteral calculus. She had been hospitalized for pyelonephritis before her last nephrostomy tube change and passed a stone while she was hospitalized which could have come from either side so we left the nephrostomy tube in place until after delivery. She delivered last week and is now here for evaluation and management. She has had a CT urogram earlier today which I reviewed. This shows nephrostomy tube in  good position on the left I see no obvious stone in the kidney on the left or along the expected course of the ureter. She does have what appears to be nonobstructing stone measuring at least 5-6 mm in the right lower calyx but no ureteral stone on the right either. Over read is pending. All  -11/23/20-patient with history of bilateral ureteral calculi and intrauterine pregnancy as above. Had nephrostomy tube placed on the left side and ultimately passed that stone. Nephrostomy tube was removed by interventional radiology after antegrade nephrostogram looked good. She has known right renal calculus and is now here for follow-up KUB to assess for visualization for possible ESL. No interim GU issues.  Micro urinalysis shows 0-2 RBCs.  KUB is reviewed today and shows no significant bony abnormalities. There is significant overlying bowel gas in the region of the right kidney. There is questionable calcification just off the tip of the rib which likely represents one of the stones in the kidney. The lower pole of the kidney is difficult to visualize. Her stone could be seen on scout of the CT scan.     ALLERGIES: No Allergies    MEDICATIONS: Ampicillin 500Mg   Ibuprofen     GU PSH: No GU PSH    NON-GU PSH: No Non-GU PSH    GU PMH: Renal calculus - 09/03/2020 Ureteral obstruction secondary to calculous - 09/03/2020, - 08/08/2020, - 07/23/2020    NON-GU PMH: No Non-GU PMH    FAMILY HISTORY: No Family History    SOCIAL HISTORY: No Social History    REVIEW OF SYSTEMS:    GU Review Female:   Patient denies frequent urination, hard to postpone urination, burning /pain with urination,  get up at night to urinate, leakage of urine, stream starts and stops, trouble starting your stream, have to strain to urinate, and being pregnant.  Gastrointestinal (Upper):   Patient reports indigestion/ heartburn. Patient denies nausea and vomiting.  Gastrointestinal (Lower):   Patient denies diarrhea and constipation.   Constitutional:   Patient denies fever, night sweats, weight loss, and fatigue.  Skin:   Patient denies skin rash/ lesion and itching.  Eyes:   Patient denies blurred vision and double vision.  Ears/ Nose/ Throat:   Patient denies sore throat and sinus problems.  Hematologic/Lymphatic:   Patient denies swollen glands and easy bruising.  Cardiovascular:   Patient denies chest pains and leg swelling.  Respiratory:   Patient denies cough and shortness of breath.  Endocrine:   Patient denies excessive thirst.  Musculoskeletal:   Patient reports back pain. Patient denies joint pain.  Neurological:   Patient denies headaches and dizziness.  Psychologic:   Patient denies depression and anxiety.   VITAL SIGNS: None   Complexity of Data:  Source Of History:  Patient  Records Review:   Previous Doctor Records, Previous Hospital Records, Previous Patient Records  Urine Test Review:   Urinalysis   PROCEDURES:         KUB - 98338  A single view of the abdomen is obtained.      . Patient confirmed No Neulasta OnPro Device. KUB is reviewed today and shows no significant bony abnormalities. There is significant overlying bowel gas in the region of the right kidney. There is questionable calcification just off the tip of the rib which likely represents one of the stones in the kidney. The lower pole of the kidney is difficult to visualize. Her stone could be seen on scout of the CT scan.          Urinalysis w/Scope - 81001 Dipstick Dipstick Cont'd Micro  Color: Yellow Bilirubin: Neg WBC/hpf: 0 - 5/hpf  Appearance: Cloudy Ketones: Neg RBC/hpf: 0 - 2/hpf  Specific Gravity: 1.020 Blood: Trace Bacteria: Mod (26-50/hpf)  pH: 6.0 Protein: Trace Cystals: NS (Not Seen)  Glucose: Neg Urobilinogen: 0.2 Casts: NS (Not Seen)    Nitrites: Neg Trichomonas: Not Present    Leukocyte Esterase: Neg Mucous: Not Present      Epithelial Cells: 6 - 10/hpf      Yeast: NS (Not Seen)      Sperm: Not Present     ASSESSMENT:      ICD-10 Details  1 GU:   Renal calculus - N20.0 Acute, Complicated Injury     PLAN:           Orders X-Rays: KUB          Schedule         Document Letter(s):  Created for Patient: Clinical Summary         Notes:   I reviewed her KUB in detail and told the patient that stone may be very difficult to visualize. She certainly has overlying bowel contents in the region of the renal contour and up 3 ESL laxative likely would help things. I do think you would probably be able to see the stone based on scalp CT and one of the stones may be visible off the tip of the rib on today's KUB. I Minna set her up for ESL and she knows that there is a chance we might have to cancel the procedure if stone is not visualized. Risks and benefits of ESI were discussed as outlined  below.  I have discussed with the patient the risks and consequences of the procedure of extracorporeal shockwave lithotripsy to include, but not limited to: Bleeding, including bleeding in the urine, bleeding around the kidney with hematoma formation and rarely bleeding to the point of loss of the kidney, infection, damage to the surrounding structures including soft tissue and bowel perforation, residual stone fragments requiring the need for future treatments including endoscopic surgery, open surgery or percutaneous surgery. I have emphasized that study showed that up to 25% of patients will require additional procedures depending on stone size and composition. I have also discussed with the patient that the success rate for ESWL is approximately 60-90% and depends on stone location and stone composition as well as preoperative stone size. The patient voices understanding of the risks and benefits of the above and consents to the procedure.     * Signed by Karoline Caldwell, M.D. on 11/23/20 at 4:44 PM (EDT)*

## 2020-11-29 ENCOUNTER — Encounter (HOSPITAL_BASED_OUTPATIENT_CLINIC_OR_DEPARTMENT_OTHER)
Admission: RE | Disposition: A | Discharge status: Home / Self Care | Payer: Self-pay | Source: Home / Self Care | Attending: Urology

## 2020-11-29 ENCOUNTER — Other Ambulatory Visit: Payer: Self-pay

## 2020-11-29 ENCOUNTER — Encounter (HOSPITAL_BASED_OUTPATIENT_CLINIC_OR_DEPARTMENT_OTHER): Payer: Self-pay | Admitting: Urology

## 2020-11-29 ENCOUNTER — Ambulatory Visit (HOSPITAL_COMMUNITY): Payer: BC Managed Care – PPO

## 2020-11-29 ENCOUNTER — Ambulatory Visit (HOSPITAL_BASED_OUTPATIENT_CLINIC_OR_DEPARTMENT_OTHER)
Admission: RE | Admit: 2020-11-29 | Discharge: 2020-11-29 | Disposition: A | Discharge status: Home / Self Care | Payer: BC Managed Care – PPO | Attending: Urology | Admitting: Urology

## 2020-11-29 DIAGNOSIS — O99213 Obesity complicating pregnancy, third trimester: Secondary | ICD-10-CM | POA: Diagnosis not present

## 2020-11-29 DIAGNOSIS — Z3A32 32 weeks gestation of pregnancy: Secondary | ICD-10-CM | POA: Diagnosis not present

## 2020-11-29 DIAGNOSIS — N200 Calculus of kidney: Secondary | ICD-10-CM | POA: Diagnosis not present

## 2020-11-29 DIAGNOSIS — O99891 Other specified diseases and conditions complicating pregnancy: Secondary | ICD-10-CM | POA: Insufficient documentation

## 2020-11-29 DIAGNOSIS — Z87442 Personal history of urinary calculi: Secondary | ICD-10-CM | POA: Diagnosis not present

## 2020-11-29 LAB — POCT PREGNANCY, URINE: Preg Test, Ur: NEGATIVE

## 2020-11-29 SURGERY — LITHOTRIPSY, ESWL
Anesthesia: LOCAL | Laterality: Right

## 2020-11-29 MED ORDER — CIPROFLOXACIN HCL 500 MG PO TABS
500.0000 mg | ORAL_TABLET | ORAL | Status: AC
  Administered 2020-11-29: 500 mg via ORAL

## 2020-11-29 MED ORDER — DIAZEPAM 5 MG PO TABS
10.0000 mg | ORAL_TABLET | ORAL | Status: AC
  Administered 2020-11-29: 10 mg via ORAL

## 2020-11-29 MED ORDER — CIPROFLOXACIN HCL 500 MG PO TABS
ORAL_TABLET | ORAL | Status: AC
  Filled 2020-11-29: qty 1

## 2020-11-29 MED ORDER — DIPHENHYDRAMINE HCL 25 MG PO CAPS
ORAL_CAPSULE | ORAL | Status: AC
  Filled 2020-11-29: qty 1

## 2020-11-29 MED ORDER — SODIUM CHLORIDE 0.9 % IV SOLN: INTRAVENOUS | Status: DC

## 2020-11-29 MED ORDER — DIAZEPAM 5 MG PO TABS
ORAL_TABLET | ORAL | Status: AC
  Filled 2020-11-29: qty 2

## 2020-11-29 MED ORDER — DIPHENHYDRAMINE HCL 25 MG PO CAPS
25.0000 mg | ORAL_CAPSULE | ORAL | Status: AC
  Administered 2020-11-29: 25 mg via ORAL

## 2020-11-29 NOTE — Progress Notes (Signed)
No stone found procedure cancelled

## 2020-11-29 NOTE — Interval H&P Note (Signed)
History and Physical Interval Note:  11/29/2020 10:00 AM  Robin Walker  has presented today for surgery, with the diagnosis of RIGHT RENAL CALCULUS.  The various methods of treatment have been discussed with the patient and family. After consideration of risks, benefits and other options for treatment, the patient has consented to  Procedure(s): EXTRACORPOREAL SHOCK WAVE LITHOTRIPSY (ESWL) (Right) as a surgical intervention.  The patient's history has been reviewed, patient examined, no change in status, stable for surgery.  I have reviewed the patient's chart and labs.  Questions were answered to the patient's satisfaction.     [employer]

## 2020-11-29 NOTE — Progress Notes (Signed)
Patient ID: Robin Walker, female   DOB: 1995/04/28, 25 y.o.   MRN: [id number]  After looking under fluoroscopy, her stone was unable to be identified.  ESWL was cancelled and was not performed today.

## 2021-09-27 IMAGING — US US RENAL
1 series · 15 of 22 positions shown · non-contrast
Comparison: June 22, 2020.

CLINICAL DATA: Left flank pain.

EXAM:
RENAL / URINARY TRACT ULTRASOUND COMPLETE

[Series 1: us renal · 15 of 22 slices shown]
[im 1/22]
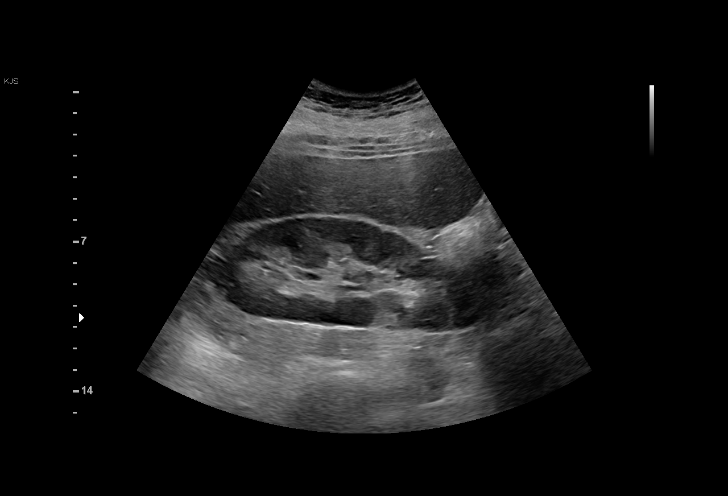
[im 3/22]
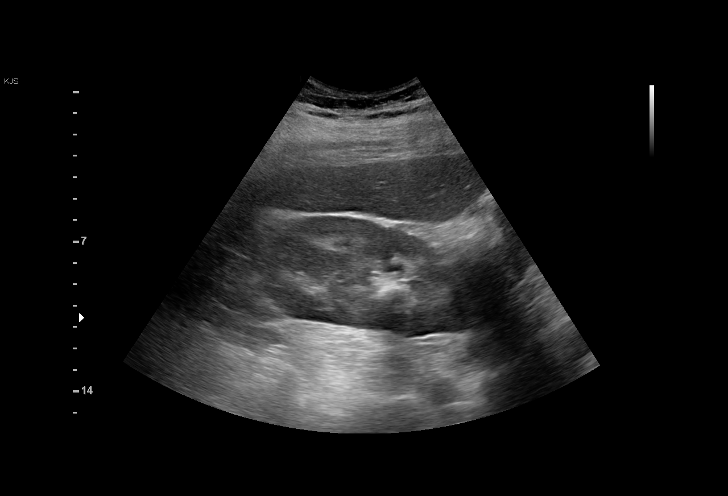
[im 4/22]
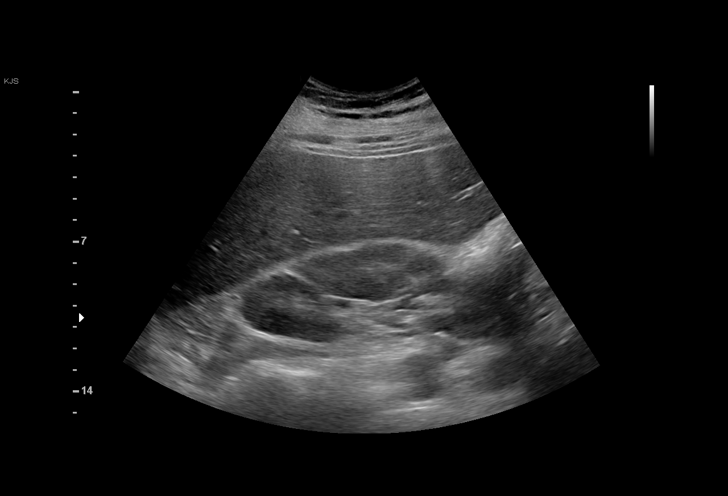
[im 6/22]
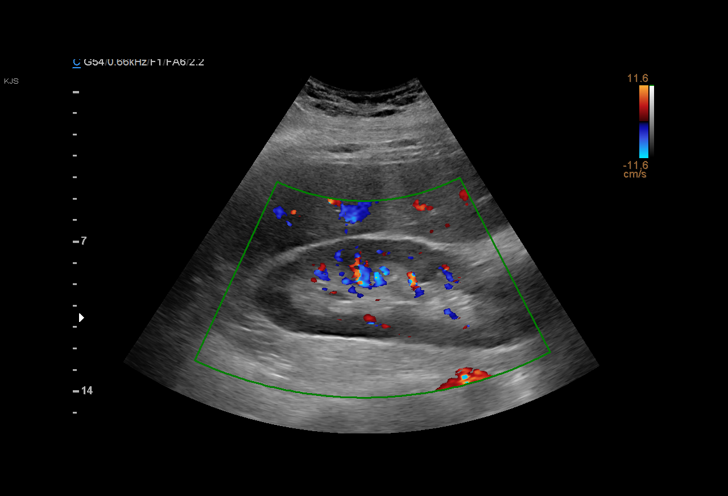
[im 7/22]
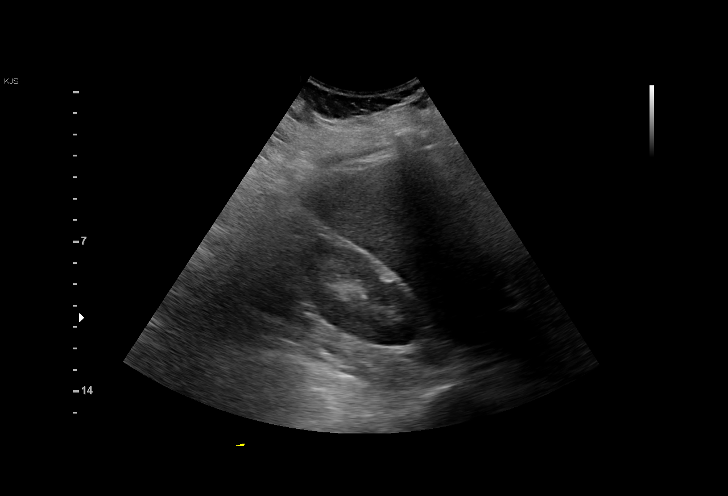
[im 9/22]
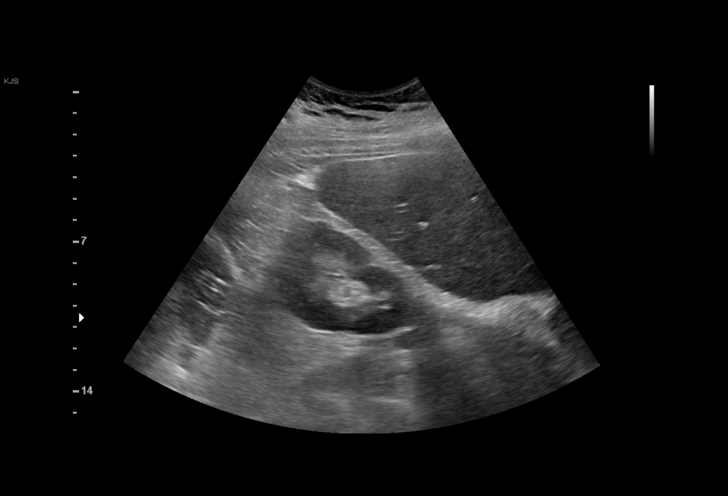
[im 10/22]
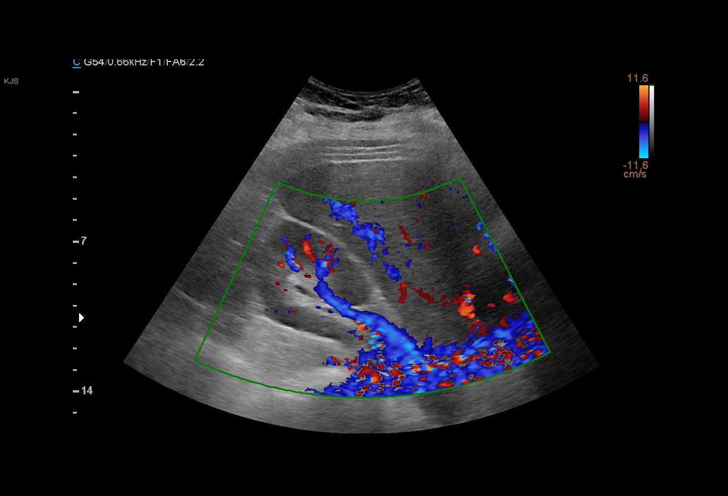
[im 12/22]
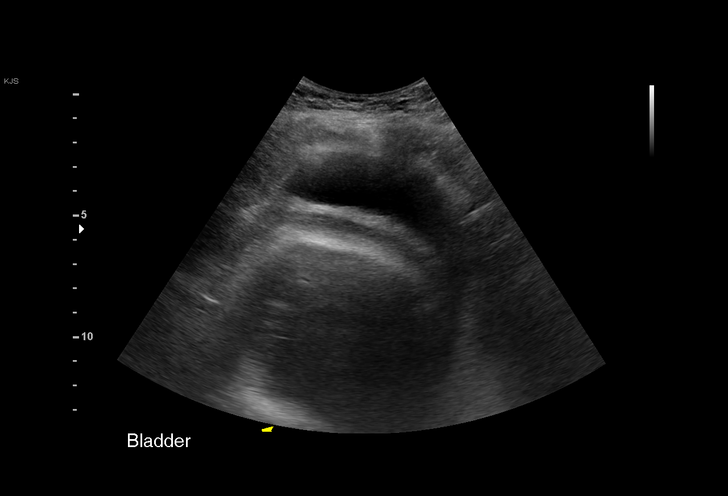
[im 13/22]
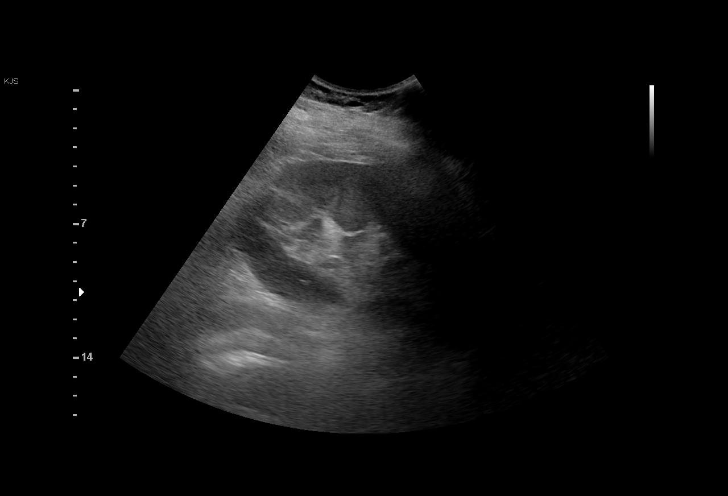
[im 14/22]
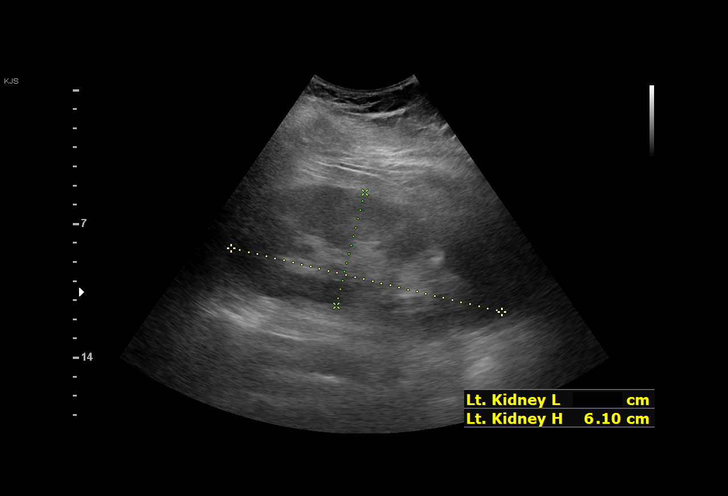
[im 16/22]
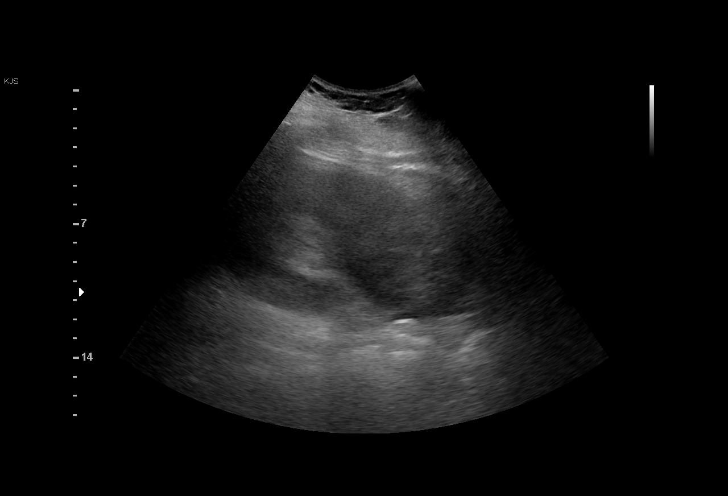
[im 17/22]
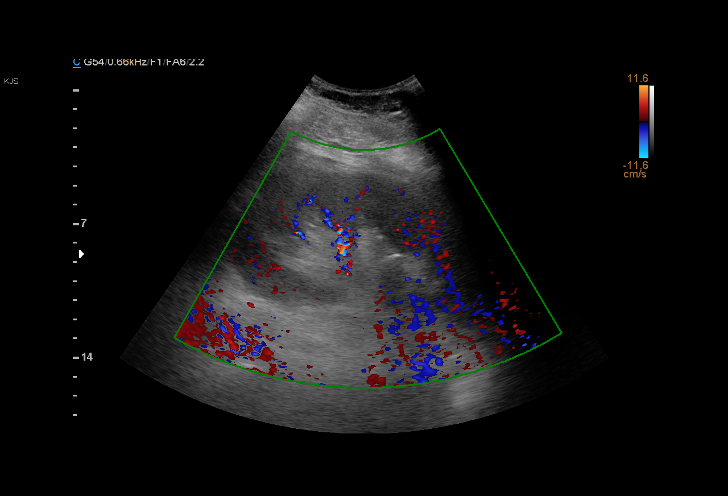
[im 19/22]
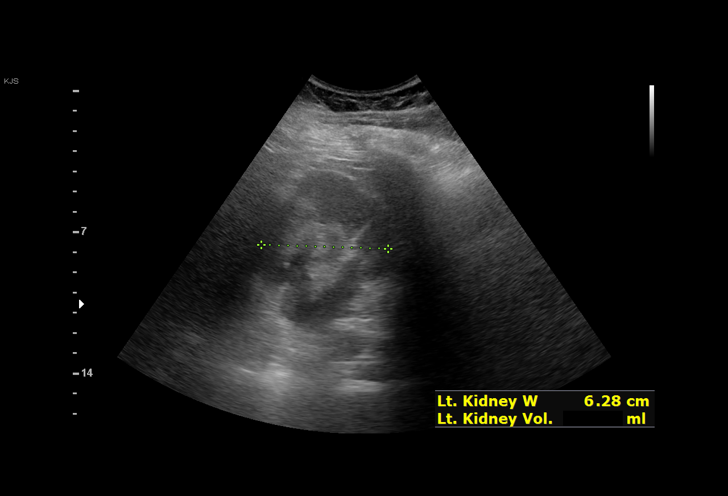
[im 20/22]
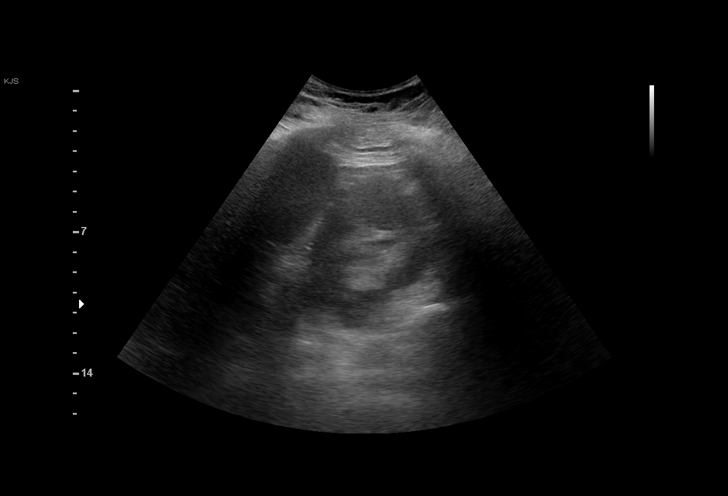
[im 22/22]
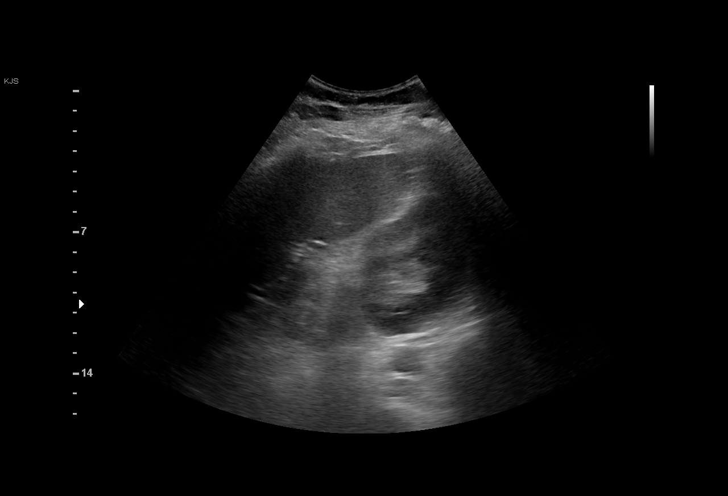

[15 of 22 positions shown; findings below may reference images not displayed]

FINDINGS: Right Kidney:

Renal measurements: 13.0 x 5.0 x 6.2 cm = volume: 210 mL.
Echogenicity within normal limits. No mass or hydronephrosis
visualized.

Left Kidney:

Renal measurements: 14.5 x 6.1 x 6.3 cm = volume: 291 mL.
Echogenicity within normal limits. No mass or hydronephrosis
visualized.

Bladder:

Appears normal for degree of bladder distention.

Other:

None.
IMPRESSION: Normal renal ultrasound.

## 2022-01-27 IMAGING — DX DG ABDOMEN 1V
2 series · 2 of 2 positions shown · non-contrast
Comparison: 11/23/2020, CT 06/22/2020

CLINICAL DATA: Preop right kidney stone

EXAM:
ABDOMEN - 1 VIEW

[abdomen kub (1 of 2)]
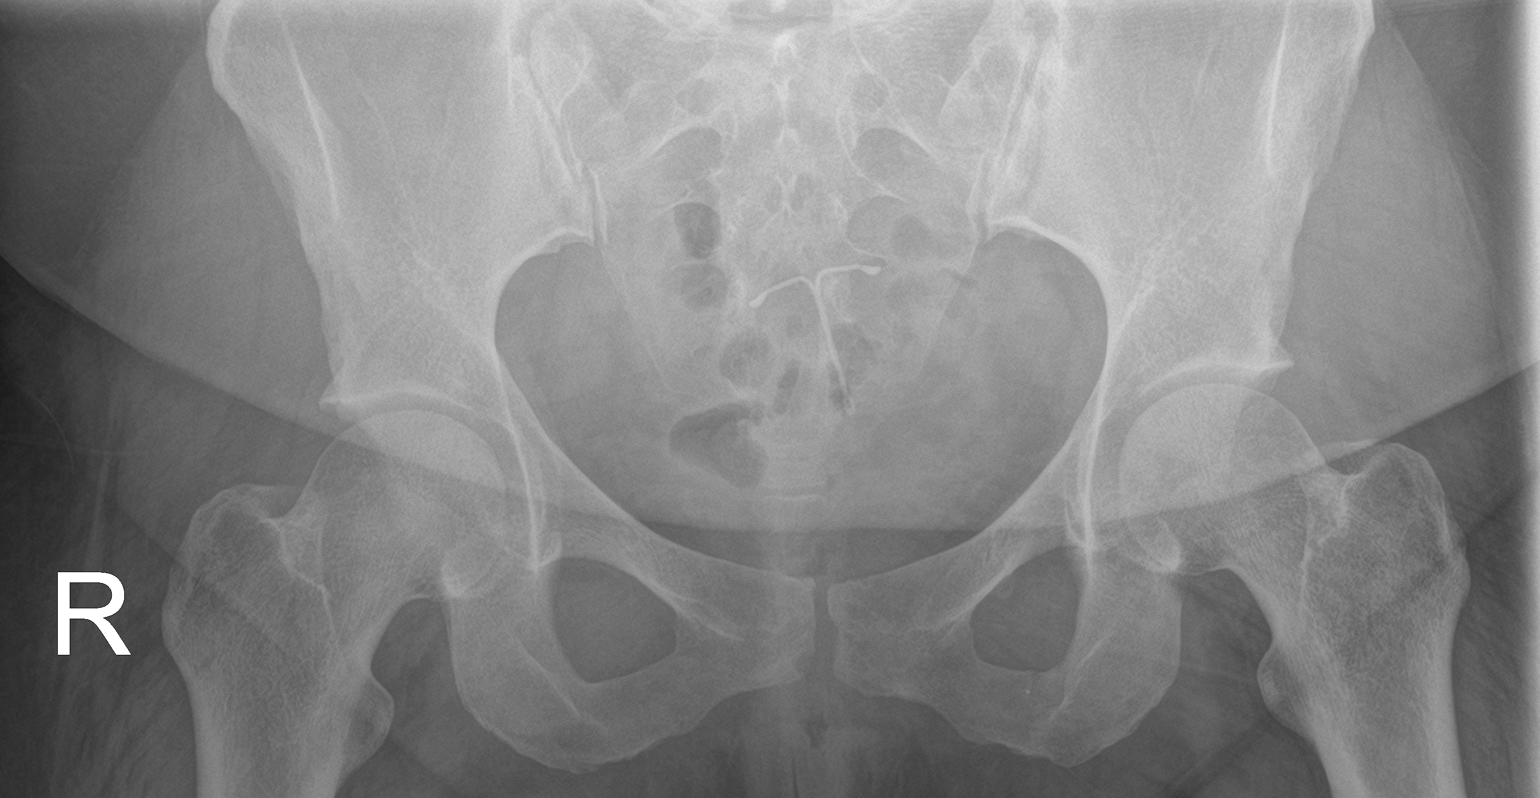

[abdomen kub (2 of 2)]
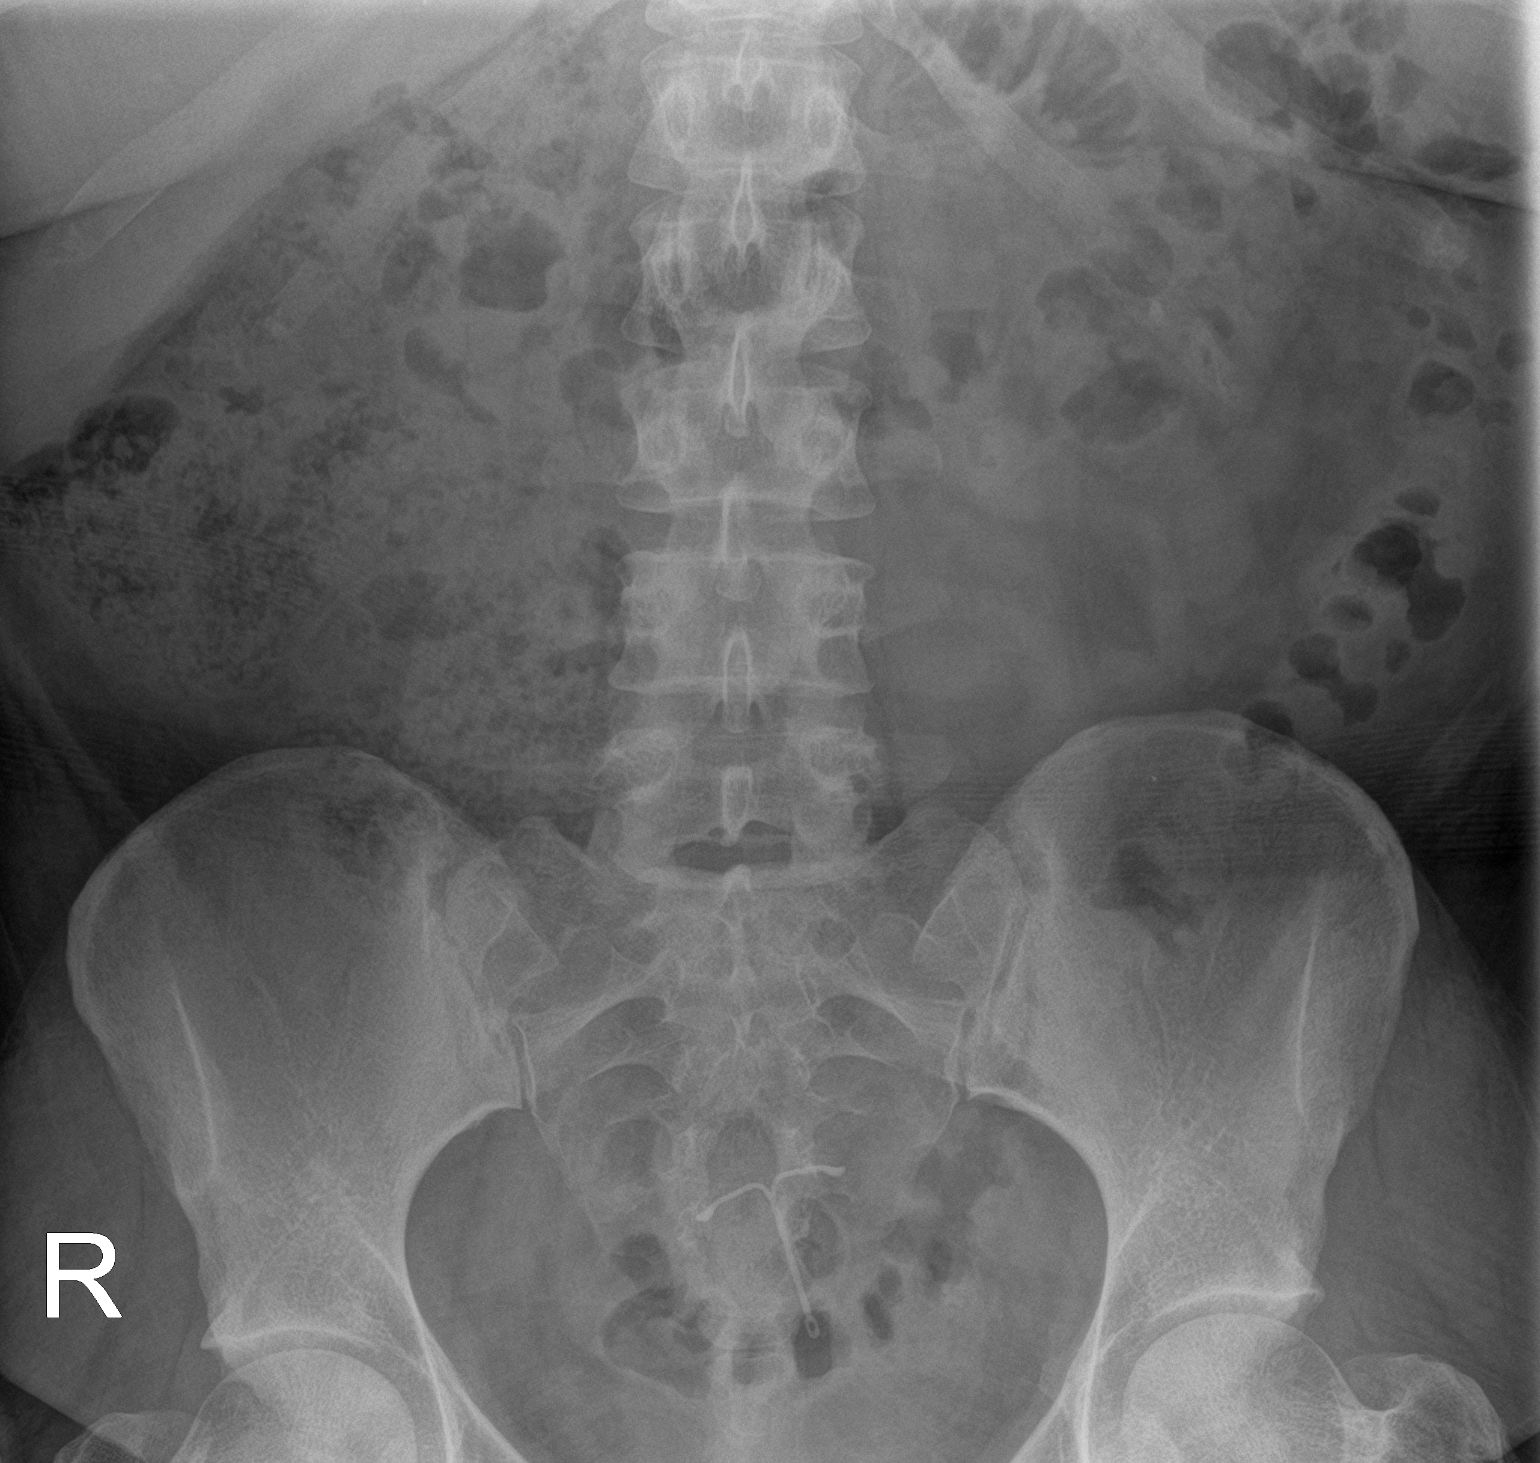

[2 of 2 positions shown; findings below may reference images not displayed]

FINDINGS: Nonobstructed bowel-gas pattern with moderate stool. IUD in the
uterus. 4 mm stone projects over lower pole left kidney. No definite
radiopaque calculi over the right kidney.
IMPRESSION: 1. 4 mm left kidney stone
2. No radiopaque calculi identified over right kidney

## 2022-08-14 ENCOUNTER — Other Ambulatory Visit (HOSPITAL_COMMUNITY): Payer: Self-pay

## 2023-01-30 ENCOUNTER — Telehealth: Payer: BC Managed Care – PPO | Admitting: Family Medicine

## 2023-01-30 DIAGNOSIS — J40 Bronchitis, not specified as acute or chronic: Secondary | ICD-10-CM

## 2023-01-30 MED ORDER — PROMETHAZINE-DM 6.25-15 MG/5ML PO SYRP: 5.0000 mL | ORAL_SOLUTION | Freq: Four times a day (QID) | ORAL | 0 refills | Status: AC | PRN

## 2023-01-30 MED ORDER — AZITHROMYCIN 250 MG PO TABS: ORAL_TABLET | ORAL | 0 refills | Status: AC

## 2023-01-30 NOTE — Progress Notes (Signed)
Virtual Visit Consent   [facility], you are scheduled for a virtual visit with a [facility] provider today. Just as with appointments in the office, your consent must be obtained to participate. Your consent will be active for this visit and any virtual visit you may have with one of our providers in the next 365 days. If you have a MyChart account, a copy of this consent can be sent to you electronically.  As this is a virtual visit, video technology does not allow for your provider to perform a traditional examination. This may limit your provider's ability to fully assess your condition. If your provider identifies any concerns that need to be evaluated in person or the need to arrange testing (such as labs, EKG, etc.), we will make arrangements to do so. Although advances in technology are sophisticated, we cannot ensure that it will always work on either your end or our end. If the connection with a video visit is poor, the visit may have to be switched to a telephone visit. With either a video or telephone visit, we are not always able to ensure that we have a secure connection.  By engaging in this virtual visit, you consent to the provision of healthcare and authorize for your insurance to be billed (if applicable) for the services provided during this visit. Depending on your insurance coverage, you may receive a charge related to this service.  I need to obtain your verbal consent now. Are you willing to proceed with your visit today? Robin Walker has provided verbal consent on 01/30/2023 for a virtual visit (video or telephone). Georgana Curio, FNP  Date: 01/30/2023 2:46 PM  Virtual Visit via Video Note   I, Georgana Curio, connected with  Robin Walker  ([id number], [birth year]) on 01/30/23 at  2:45 PM EST by a video-enabled telemedicine application and verified that I am speaking with the correct person using two identifiers.  Location: Patient: Virtual Visit Location  Patient: Home Provider: Virtual Visit Location Provider: Home Office   I discussed the limitations of evaluation and management by telemedicine and the availability of in person appointments. The patient expressed understanding and agreed to proceed.    History of Present Illness: Robin Walker is a 27 y.o. who identifies as a female who was assigned female at birth, and is being seen today for cough, yellow mucus, no fever or wheezing, .Sx for 5-6 days worsening.   HPI: HPI  Problems:  Patient Active Problem List   Diagnosis Date Noted   Previous cesarean section 08/24/2020   Severe sepsis (HCC)    Pyelonephritis affecting pregnancy in third trimester 07/30/2020   Nephrolithiasis 06/22/2020   Cesarean delivery delivered 01/06/2018    Allergies: No Known Allergies Medications:  Current Outpatient Medications:    cephALEXin (KEFLEX) 500 MG capsule, Take 1 capsule (500 mg total) by mouth daily., Disp: 30 capsule, Rfl: 3   Cholecalciferol (VITAMIN D) 50 MCG (2000 UT) tablet, Take 2,000 Units by mouth daily., Disp: , Rfl:    docusate sodium (COLACE) 100 MG capsule, Take 100 mg by mouth daily., Disp: , Rfl:    ferrous sulfate 325 (65 FE) MG EC tablet, Take 325 mg by mouth every evening., Disp: , Rfl:    ibuprofen (ADVIL) 600 MG tablet, Take 1 tablet (600 mg total) by mouth every 6 (six) hours as needed for cramping, moderate pain or mild pain., Disp: 30 tablet, Rfl: 0   levothyroxine (SYNTHROID, LEVOTHROID) 125 MCG tablet, Take 125 mcg by mouth  daily before breakfast., Disp: , Rfl:    ondansetron (ZOFRAN) 4 MG tablet, Take 1 tablet (4 mg total) by mouth every 8 (eight) hours as needed for nausea or vomiting., Disp: 20 tablet, Rfl: 0   oxyCODONE-acetaminophen (PERCOCET/ROXICET) 5-325 MG tablet, Take 1-2 tablets by mouth every 4 (four) hours as needed for moderate pain., Disp: 30 tablet, Rfl: 0   Prenat w/o A-FE-Methfol-FA-DHA (PNV-DHA PO), Take 1 each by mouth daily., Disp: , Rfl:    Observations/Objective: Patient is well-developed, well-nourished in no acute distress.  Resting comfortably  at home.  Head is normocephalic, atraumatic.  No labored breathing.  Speech is clear and coherent with logical content.  Patient is alert and oriented at baseline.    Assessment and Plan: 1. Bronchitis (Primary)  Increase fluids, humidifier at night, tylenol or ibuprofen as directed, UC if needed.   Follow Up Instructions: I discussed the assessment and treatment plan with the patient. The patient was provided an opportunity to ask questions and all were answered. The patient agreed with the plan and demonstrated an understanding of the instructions.  A copy of instructions were sent to the patient via MyChart unless otherwise noted below.     The patient was advised to call back or seek an in-person evaluation if the symptoms worsen or if the condition fails to improve as anticipated.    Georgana Curio, FNP

## 2023-01-30 NOTE — Patient Instructions (Signed)

## 2023-04-17 ENCOUNTER — Telehealth: Admitting: Family Medicine

## 2023-04-17 DIAGNOSIS — J019 Acute sinusitis, unspecified: Secondary | ICD-10-CM

## 2023-04-17 DIAGNOSIS — B9689 Other specified bacterial agents as the cause of diseases classified elsewhere: Secondary | ICD-10-CM | POA: Diagnosis not present

## 2023-04-17 MED ORDER — AMOXICILLIN-POT CLAVULANATE 875-125 MG PO TABS: 1.0000 | ORAL_TABLET | Freq: Two times a day (BID) | ORAL | 0 refills | Status: DC

## 2023-04-17 NOTE — Progress Notes (Signed)

## 2023-04-28 ENCOUNTER — Telehealth: Admitting: Physician Assistant

## 2023-04-28 ENCOUNTER — Encounter

## 2023-04-28 DIAGNOSIS — B9689 Other specified bacterial agents as the cause of diseases classified elsewhere: Secondary | ICD-10-CM

## 2023-04-28 DIAGNOSIS — J019 Acute sinusitis, unspecified: Secondary | ICD-10-CM | POA: Diagnosis not present

## 2023-04-28 MED ORDER — PREDNISONE 10 MG (21) PO TBPK: ORAL_TABLET | ORAL | 0 refills | Status: DC

## 2023-04-28 MED ORDER — DOXYCYCLINE HYCLATE 100 MG PO TABS: 100.0000 mg | ORAL_TABLET | Freq: Two times a day (BID) | ORAL | 0 refills | Status: DC

## 2023-04-28 NOTE — Patient Instructions (Addendum)
 Torrie Randle, thank you for joining Piedad Climes, PA-C for today's virtual visit.  While this provider is not your primary care provider (PCP), if your PCP is located in our provider database this encounter information will be shared with them immediately following your visit.   A [facility] MyChart account gives you access to today's visit and all your visits, tests, and labs performed at [facility] " click here if you don't have a [facility] MyChart account or go to [email or web address]  Consent: (Patient) Robin Walker provided verbal consent for this virtual visit at the beginning of the encounter.  Current Medications:  Current Outpatient Medications:    doxycycline (VIBRA-TABS) 100 MG tablet, Take 1 tablet (100 mg total) by mouth 2 (two) times daily., Disp: 14 tablet, Rfl: 0   predniSONE (STERAPRED UNI-PAK 21 TAB) 10 MG (21) TBPK tablet, Take following package directions, Disp: 21 tablet, Rfl: 0   levothyroxine (SYNTHROID, LEVOTHROID) 125 MCG tablet, Take 125 mcg by mouth daily before breakfast., Disp: , Rfl:    Medications ordered in this encounter:  Meds ordered this encounter  Medications   predniSONE (STERAPRED UNI-PAK 21 TAB) 10 MG (21) TBPK tablet    Sig: Take following package directions    Dispense:  21 tablet    Refill:  0    Supervising Provider:   Merrilee Jansky [[id number]]   doxycycline (VIBRA-TABS) 100 MG tablet    Sig: Take 1 tablet (100 mg total) by mouth 2 (two) times daily.    Dispense:  14 tablet    Refill:  0    Supervising Provider:   Merrilee Jansky [[id number]]     *If you need refills on other medications prior to your next appointment, please contact your pharmacy*  Follow-Up: Call back or seek an in-person evaluation if the symptoms worsen or if the condition fails to improve as anticipated.  [facility] [phone]  Other Instructions Stop the Augmentin. Please take the new antibiotic  (Doxycycline) as directed.  Increase fluid intake.  Use Saline nasal spray.  Take a daily multivitamin. Ok to use the Coricidin HBP or try Sudafed instead since no history of high BP. Start the steroid, taking as directed.  Place a humidifier in the bedroom.  Please seek an in-person evaluation if symptoms are not resolving or you note any new/worsening symptoms despite treatment change.  Sinusitis Sinusitis is redness, soreness, and swelling (inflammation) of the paranasal sinuses. Paranasal sinuses are air pockets within the bones of your face (beneath the eyes, the middle of the forehead, or above the eyes). In healthy paranasal sinuses, mucus is able to drain out, and air is able to circulate through them by way of your nose. However, when your paranasal sinuses are inflamed, mucus and air can become trapped. This can allow bacteria and other germs to grow and cause infection. Sinusitis can develop quickly and last only a short time (acute) or continue over a long period (chronic). Sinusitis that lasts for more than 12 weeks is considered chronic.  CAUSES  Causes of sinusitis include: Allergies. Structural abnormalities, such as displacement of the cartilage that separates your nostrils (deviated septum), which can decrease the air flow through your nose and sinuses and affect sinus drainage. Functional abnormalities, such as when the small hairs (cilia) that line your sinuses and help remove mucus do not work properly or are not present. SYMPTOMS  Symptoms of acute and chronic sinusitis are the same. The primary symptoms are pain  and pressure around the affected sinuses. Other symptoms include: Upper toothache. Earache. Headache. Bad breath. Decreased sense of smell and taste. A cough, which worsens when you are lying flat. Fatigue. Fever. Thick drainage from your nose, which often is green and may contain pus (purulent). Swelling and warmth over the affected sinuses. DIAGNOSIS  Your  caregiver will perform a physical exam. During the exam, your caregiver may: Look in your nose for signs of abnormal growths in your nostrils (nasal polyps). Tap over the affected sinus to check for signs of infection. View the inside of your sinuses (endoscopy) with a special imaging device with a light attached (endoscope), which is inserted into your sinuses. If your caregiver suspects that you have chronic sinusitis, one or more of the following tests may be recommended: Allergy tests. Nasal culture A sample of mucus is taken from your nose and sent to a lab and screened for bacteria. Nasal cytology A sample of mucus is taken from your nose and examined by your caregiver to determine if your sinusitis is related to an allergy. TREATMENT  Most cases of acute sinusitis are related to a viral infection and will resolve on their own within 10 days. Sometimes medicines are prescribed to help relieve symptoms (pain medicine, decongestants, nasal steroid sprays, or saline sprays).  However, for sinusitis related to a bacterial infection, your caregiver will prescribe antibiotic medicines. These are medicines that will help kill the bacteria causing the infection.  Rarely, sinusitis is caused by a fungal infection. In theses cases, your caregiver will prescribe antifungal medicine. For some cases of chronic sinusitis, surgery is needed. Generally, these are cases in which sinusitis recurs more than 3 times per year, despite other treatments. HOME CARE INSTRUCTIONS  Drink plenty of water. Water helps thin the mucus so your sinuses can drain more easily. Use a humidifier. Inhale steam 3 to 4 times a day (for example, sit in the bathroom with the shower running). Apply a warm, moist washcloth to your face 3 to 4 times a day, or as directed by your caregiver. Use saline nasal sprays to help moisten and clean your sinuses. Take over-the-counter or prescription medicines for pain, discomfort, or fever only  as directed by your caregiver. SEEK IMMEDIATE MEDICAL CARE IF: You have increasing pain or severe headaches. You have nausea, vomiting, or drowsiness. You have swelling around your face. You have vision problems. You have a stiff neck. You have difficulty breathing. MAKE SURE YOU:  Understand these instructions. Will watch your condition. Will get help right away if you are not doing well or get worse. Document Released: 01/20/2005 Document Revised: 04/14/2011 Document Reviewed: 02/04/2011 [facility] Patient Information 2014 [place], [place].    If you have been instructed to have an in-person evaluation today at a local Urgent Care facility, please use the link below. It will take you to a list of all of our available [facility], including address, phone number and hours of operation. Please do not delay care.  [facility]  If you or a family member do not have a primary care provider, use the link below to schedule a visit and establish care. When you choose a [facility] primary care physician or advanced practice provider, you gain a long-term partner in health. Find a Primary Care Provider  Learn more about [facility]'s in-office and virtual care options: [facility] - Get Care Now

## 2023-04-28 NOTE — Progress Notes (Signed)
 Virtual Visit Consent   [facility], you are scheduled for a virtual visit with a [facility] provider today. Just as with appointments in the office, your consent must be obtained to participate. Your consent will be active for this visit and any virtual visit you may have with one of our providers in the next 365 days. If you have a MyChart account, a copy of this consent can be sent to you electronically.  As this is a virtual visit, video technology does not allow for your provider to perform a traditional examination. This may limit your provider's ability to fully assess your condition. If your provider identifies any concerns that need to be evaluated in person or the need to arrange testing (such as labs, EKG, etc.), we will make arrangements to do so. Although advances in technology are sophisticated, we cannot ensure that it will always work on either your end or our end. If the connection with a video visit is poor, the visit may have to be switched to a telephone visit. With either a video or telephone visit, we are not always able to ensure that we have a secure connection.  By engaging in this virtual visit, you consent to the provision of healthcare and authorize for your insurance to be billed (if applicable) for the services provided during this visit. Depending on your insurance coverage, you may receive a charge related to this service.  I need to obtain your verbal consent now. Are you willing to proceed with your visit today? [name] has provided verbal consent on 04/28/2023 for a virtual visit (video or telephone). Robin Walker, [place]  Date: 04/28/2023 6:20 PM   Virtual Visit via Video Note   I, Robin Walker, connected with  Maylon Peppers  ([id number], 05-14-1995) on 04/28/23 at  6:15 PM EDT by a video-enabled telemedicine application and verified that I am speaking with the correct person using two identifiers.  Location: Patient:  Virtual Visit Location Patient: Home Provider: Virtual Visit Location Provider: Home Office   I discussed the limitations of evaluation and management by telemedicine and the availability of in person appointments. The patient expressed understanding and agreed to proceed.    History of Present Illness: Robin Walker is a 28 y.o. who identifies as a female who was assigned female at birth, and is being seen today for persistent sinus congestion, sinus pressure and pain. Notes continued thick, green nasal discharge. Denies fever, chills. Some drainage without sore throat. Denies shortness of breath. Is currently on Augmentin prescribed on 3/14. Notes tolerating antibiotic but without any improvement of symptoms.  OTC -- Alka Seltzer, Coricidin HBP.   HPI: HPI  Problems:  Patient Active Problem List   Diagnosis Date Noted   Previous cesarean section 08/24/2020   Severe sepsis (HCC)    Pyelonephritis affecting pregnancy in third trimester 07/30/2020   Nephrolithiasis 06/22/2020   Cesarean delivery delivered 01/06/2018    Allergies: No Known Allergies Medications:  Current Outpatient Medications:    doxycycline (VIBRA-TABS) 100 MG tablet, Take 1 tablet (100 mg total) by mouth 2 (two) times daily., Disp: 14 tablet, Rfl: 0   predniSONE (STERAPRED UNI-PAK 21 TAB) 10 MG (21) TBPK tablet, Take following package directions, Disp: 21 tablet, Rfl: 0   levothyroxine (SYNTHROID, LEVOTHROID) 125 MCG tablet, Take 125 mcg by mouth daily before breakfast., Disp: , Rfl:   Observations/Objective: Patient is well-developed, well-nourished in no acute distress.  Resting comfortably at home.  Head is normocephalic, atraumatic.  No  labored breathing. Speech is clear and coherent with logical content.  Patient is alert and oriented at baseline.   Assessment and Plan: 1. Acute bacterial sinusitis (Primary) - predniSONE (STERAPRED UNI-PAK 21 TAB) 10 MG (21) TBPK tablet; Take following package  directions  Dispense: 21 tablet; Refill: 0 - doxycycline (VIBRA-TABS) 100 MG tablet; Take 1 tablet (100 mg total) by mouth 2 (two) times daily.  Dispense: 14 tablet; Refill: 0  Not responding to Augmentin. Stop Augmentin. Start Doxycycline. Will add on Sterapred pack. Continue nasal steroid spray and OTC decongestants. If not resolving with change in treatment,  or any new/worsening symptoms despite this, will need an in-person evaluation.  Follow Up Instructions: I discussed the assessment and treatment plan with the patient. The patient was provided an opportunity to ask questions and all were answered. The patient agreed with the plan and demonstrated an understanding of the instructions.  A copy of instructions were sent to the patient via MyChart unless otherwise noted below.   The patient was advised to call back or seek an in-person evaluation if the symptoms worsen or if the condition fails to improve as anticipated.    Robin Climes, PA-C

## 2023-05-05 ENCOUNTER — Emergency Department (HOSPITAL_COMMUNITY)
Admission: EM | Admit: 2023-05-05 | Discharge: 2023-05-05 | Disposition: A | Discharge status: Home / Self Care | Attending: Emergency Medicine | Admitting: Emergency Medicine

## 2023-05-05 ENCOUNTER — Emergency Department (HOSPITAL_COMMUNITY)

## 2023-05-05 ENCOUNTER — Encounter (HOSPITAL_COMMUNITY): Payer: Self-pay

## 2023-05-05 ENCOUNTER — Other Ambulatory Visit: Payer: Self-pay

## 2023-05-05 DIAGNOSIS — R739 Hyperglycemia, unspecified: Secondary | ICD-10-CM | POA: Diagnosis not present

## 2023-05-05 DIAGNOSIS — R509 Fever, unspecified: Secondary | ICD-10-CM | POA: Diagnosis present

## 2023-05-05 DIAGNOSIS — J302 Other seasonal allergic rhinitis: Secondary | ICD-10-CM | POA: Diagnosis not present

## 2023-05-05 LAB — TROPONIN I (HIGH SENSITIVITY): Troponin I (High Sensitivity): 4 ng/L (ref <18)

## 2023-05-05 LAB — CBC
HCT: 41.7 % (ref 36.0–46.0)
Hemoglobin: 13.9 g/dL (ref 12.0–15.0)
MCH: 29.1 pg (ref 26.0–34.0)
MCHC: 33.3 g/dL (ref 30.0–36.0)
MCV: 87.4 fL (ref 80.0–100.0)
Platelets: 230 10*3/uL (ref 150–400)
RBC: 4.77 MIL/uL (ref 3.87–5.11)
RDW: 12.7 % (ref 11.5–15.5)
WBC: 10.2 10*3/uL (ref 4.0–10.5)
nRBC: 0 % (ref 0.0–0.2)

## 2023-05-05 LAB — RESP PANEL BY RT-PCR (RSV, FLU A&B, COVID)  RVPGX2
Influenza A by PCR: NEGATIVE
Influenza B by PCR: NEGATIVE
Resp Syncytial Virus by PCR: NEGATIVE
SARS Coronavirus 2 by RT PCR: NEGATIVE

## 2023-05-05 LAB — BASIC METABOLIC PANEL WITH GFR
Anion gap: 8 (ref 5–15)
BUN: 13 mg/dL (ref 6–20)
CO2: 25 mmol/L (ref 22–32)
Calcium: 8.9 mg/dL (ref 8.9–10.3)
Chloride: 104 mmol/L (ref 98–111)
Creatinine, Ser: 0.77 mg/dL (ref 0.44–1.00)
GFR, Estimated: >60 mL/min (ref >60)
Glucose, Bld: 121 mg/dL — ABNORMAL HIGH (ref 70–99)
Potassium: 4.1 mmol/L (ref 3.5–5.1)
Sodium: 137 mmol/L (ref 135–145)

## 2023-05-05 MED ORDER — IOHEXOL 350 MG/ML SOLN
75.0000 mL | Freq: Once | INTRAVENOUS | Status: AC | PRN
  Administered 2023-05-05: 75 mL via INTRAVENOUS

## 2023-05-05 NOTE — ED Notes (Signed)
 Patient transported to X-ray

## 2023-05-05 NOTE — ED Provider Notes (Signed)
 [facility] DEPARTMENT AT [facility] Provider Note   CSN: [id number] Arrival date & time: 05/05/23  0415     History  Chief Complaint  Patient presents with   Fever    Robin Walker is a 28 y.o. female.  The history is provided by the patient.  Fever She has history of hypothyroidism and comes in complaining of fever.  She has had problems with her sinuses for about the last 3 weeks and had been given a course of amoxicillin-clavulanate without any benefit, and was then given a prescription for doxycycline and prednisone.  She continues to have nasal congestion.  She initially had green rhinorrhea but it is now clear.  Tonight, she started running fever up to 101 degrees with associated chills and sweats.  She denies any cough, arthralgias, myalgias.  She states that she usually has problems with her sinuses every spring and they continue to bother her through this summer and until the early fall.   Home Medications Prior to Admission medications   Medication Sig Start Date End Date Taking? Authorizing Provider  levothyroxine (SYNTHROID, LEVOTHROID) 125 MCG tablet Take 125 mcg by mouth daily before breakfast.    [provider]      Allergies    Patient has no known allergies.    Review of Systems   Review of Systems  Constitutional:  Positive for fever.  All other systems reviewed and are negative.   Physical Exam Updated Vital Signs BP 123/83   Pulse 93   Temp 98.7 F (37.1 C)   Resp 18   SpO2 99%  Physical Exam Vitals and nursing note reviewed.   28 year old female, resting comfortably and in no acute distress. Vital signs are significant for elevated blood pressure and borderline elevated heart rate. Oxygen saturation is 99%, which is normal. Head is normocephalic and atraumatic. PERRLA, EOMI. Oropharynx is clear.  There is minimal tenderness to palpation over the maxillary sinuses, no tenderness over the frontal sinuses.  Nasal  cavities appear clear-no significant rhinorrhea or edema of turbinates. Neck is nontender and supple without adenopathy . Lungs are clear without rales, wheezes, or rhonchi. Chest is nontender. Heart has regular rate and rhythm without murmur. Abdomen is soft, flat, nontender. Extremities have no cyanosis or edema, full range of motion is present. Skin is warm and dry without rash. Neurologic: Mental status is normal, cranial nerves are intact, moves all extremities equally.  ED Results / Procedures / Treatments   Labs (all labs ordered are listed, but only abnormal results are displayed) Labs Reviewed  BASIC METABOLIC PANEL WITH GFR - Abnormal; Notable for the following components:      Result Value   Glucose, Bld 121 (*)    All other components within normal limits  RESP PANEL BY RT-PCR (RSV, FLU A&B, COVID)  RVPGX2  CBC  HCG, SERUM, QUALITATIVE  TROPONIN I (HIGH SENSITIVITY)    EKG EKG Interpretation Date/Time:  Tuesday May 05 2023 04:30:12 EDT Ventricular Rate:  101 PR Interval:  150 QRS Duration:  84 QT Interval:  326 QTC Calculation: 422 R Axis:   30  Text Interpretation: Sinus tachycardia Possible Inferior infarct , age undetermined Cannot rule out Anterior infarct , age undetermined Abnormal ECG When compared with ECG of 31-Jul-2020 01:14,hr  HEART RATE has decreased  ST-t abnormality is no longer present Confirmed by Dione Booze ([id number]) on 05/05/2023 5:23:51 AM  Radiology CT Maxillofacial W Contrast Result Date: 05/05/2023 CLINICAL DATA:  28 year old  female with fever, maxillofacial pain, sinusitis. EXAM: CT MAXILLOFACIAL WITH CONTRAST TECHNIQUE: Multidetector CT imaging of the maxillofacial structures was performed with intravenous contrast. Multiplanar CT image reconstructions were also generated. RADIATION DOSE REDUCTION: This exam was performed according to the departmental dose-optimization program which includes automated exposure control, adjustment of the  mA and/or kV according to patient size and/or use of iterative reconstruction technique. CONTRAST:  75mL OMNIPAQUE IOHEXOL 350 MG/ML SOLN COMPARISON:  None Available. FINDINGS: Osseous: Mandible intact and normally located. No acute dental finding. Bilateral maxilla, zygoma, pterygoid, nasal bones appear intact. Normal background bone mineralization. Intact visible calvarium. Skull base and visible cervical vertebrae appear intact and aligned. Orbits: Intact orbital walls. Globes and intraorbital soft tissues appear symmetric and normal. Sinuses: Clear throughout. Tympanic cavities and mastoids are clear. Soft tissues: Negative visible thyroid, larynx, pharynx, parapharyngeal spaces, retropharyngeal space, sublingual space, submandibular spaces, bilateral masticator and parotid spaces. Visible cervical lymph nodes are diminutive and within normal limits. No discrete soft tissue inflammation identified. Visible major vascular structures in the neck and at the skull base appear patent and normal. Limited intracranial: Negative. Major intracranial vascular structures appear to be enhancing as expected. IMPRESSION: Normal Face CT with contrast. Electronically Signed   By: Odessa Fleming M.D.   On: 05/05/2023 06:23   DG Chest 2 View Result Date: 05/05/2023 CLINICAL DATA:  Chest pain EXAM: CHEST - 2 VIEW COMPARISON:  05/06/2021 FINDINGS: The heart size and mediastinal contours are within normal limits. Both lungs are clear. The visualized skeletal structures are unremarkable. IMPRESSION: No active cardiopulmonary disease. Electronically Signed   By: Kennith Center M.D.   On: 05/05/2023 05:08    Procedures Procedures    Medications Ordered in ED Medications  iohexol (OMNIPAQUE) 350 MG/ML injection 75 mL (75 mLs Intravenous Contrast Given 05/05/23 1610)    ED Course/ Medical Decision Making/ A&P                                 Medical Decision Making Amount and/or Complexity of Data Reviewed Labs:  ordered. Radiology: ordered.  Risk Prescription drug management.   Sinusitis which I believe is more allergic than bacterial.  Reported fever tonight, question viral infection such as COVID-19 or influenza or RSV versus other viruses for which we do not routinely test.  I have reviewed her past records and do note ED visit on 04/17/2023 with diagnosis of acute bacterial sinusitis and treatment with amoxicillin-clavulanate.  I note follow-up ED visit on 04/28/2023 with diagnosis of acute bacterial sinusitis treated with doxycycline and a prednisone taper.  Since her sinus problems have not responded to antibiotics, I have decided to proceed with CT scan to evaluate for possible sinusitis.  I have reviewed her laboratory tests and my interpretation is elevated random glucose which was likely exacerbated by steroid use, normal CBC, normal troponin.  Chest x-ray shows no active cardiopulmonary disease.  I independently viewed the images, and agree with the radiologist's interpretation.  I have reviewed her electrocardiogram, and my interpretation is borderline and tachycardia otherwise unremarkable ECG.  I suspect she should be managed more with intranasal steroids and antihistamines than with antibiotics.  Respiratory pathogen panel has come back negative for COVID-19, influenza, RSV.  Maxillofacial CT scan shows no evidence of sinusitis.  Have independently viewed the images, and agree with the radiologist's interpretation.  I am advising the patient to stop her prednisone and doxycycline and start using over-the-counter  fluticasone and second-generation antihistamines.  I am referring her to ENT for follow-up.  Final Clinical Impression(s) / ED Diagnoses Final diagnoses:  Seasonal allergic rhinitis, unspecified trigger  Elevated random blood glucose level    Rx / DC Orders ED Discharge Orders     None         Dione Booze, MD 05/05/23 [phone]

## 2023-05-05 NOTE — Discharge Instructions (Addendum)
 Please stop taking the antibiotic and the steroid.  They are not helping you and there is no sign of a significant infection.  Please start using fluticasone (Flonase) nasal spray.  Use 2 sprays in each nostril every day.  Also, start taking an over-the-counter antihistamine such as loratadine (Claritin) or cetirizine (Zyrtec) once a day.  I recommend that you start using fluticasone and antihistamines every spring.  Start 1-2 weeks before you expect the pollen to start to be a problem.  Continue using these medications through the summer.

## 2023-05-05 NOTE — ED Triage Notes (Addendum)
 Pt presents via POV c/o fever since last night. Reports on Prednisone and abx currently on abx for a sinus infection.   Pt reports some chest pain. Reports "feels like acid reflux".

## 2023-05-07 ENCOUNTER — Encounter (HOSPITAL_BASED_OUTPATIENT_CLINIC_OR_DEPARTMENT_OTHER): Payer: Self-pay

## 2023-05-07 ENCOUNTER — Ambulatory Visit (HOSPITAL_BASED_OUTPATIENT_CLINIC_OR_DEPARTMENT_OTHER)
Admission: EM | Admit: 2023-05-07 | Discharge: 2023-05-07 | Disposition: A | Discharge status: Home / Self Care | Attending: Family Medicine | Admitting: Family Medicine

## 2023-05-07 DIAGNOSIS — J209 Acute bronchitis, unspecified: Secondary | ICD-10-CM

## 2023-05-07 MED ORDER — TRIAMCINOLONE ACETONIDE 40 MG/ML IJ SUSP
40.0000 mg | Freq: Once | INTRAMUSCULAR | Status: AC
  Administered 2023-05-07: 40 mg via INTRAMUSCULAR

## 2023-05-07 MED ORDER — ALBUTEROL SULFATE HFA 108 (90 BASE) MCG/ACT IN AERS: 1.0000 | INHALATION_SPRAY | Freq: Four times a day (QID) | RESPIRATORY_TRACT | 0 refills | Status: AC | PRN

## 2023-05-07 NOTE — ED Provider Notes (Signed)
 [name]    CSN: [id number] Arrival date & time: 05/07/23  0803      History   Chief Complaint No chief complaint on file.   HPI Robin Walker is a 28 y.o. female.   Patient is a 28 year old female who presents today with cough, congestion, wheezing.  Started out as what she thought was sinus infection and was treated with amoxicillin which did not resolve.  Then she was treated with doxycycline which seem to get rid of the sinus infection.  Now she is having more trouble with breathing, wheezing.  Had an old inhaler use that with some relief.  Has been taken Coricidin.  Does have allergies and has been taking her allergy medication.  Denies any fevers or chills.     Past Medical History:  Diagnosis Date   History of kidney stones    Hypothyroidism    PONV (postoperative nausea and vomiting)     Patient Active Problem List   Diagnosis Date Noted   Previous cesarean section 08/24/2020   Severe sepsis (HCC)    Pyelonephritis affecting pregnancy in third trimester 07/30/2020   Nephrolithiasis 06/22/2020   Cesarean delivery delivered 01/06/2018    Past Surgical History:  Procedure Laterality Date   CESAREAN SECTION N/A 01/06/2018   Procedure: CESAREAN SECTION;  Surgeon: Candice Camp, MD;  Location: [facility] BIRTHING SUITES;  Service: Obstetrics;  Laterality: N/A;  Primary edc12/8 NKDA need RNFA   CESAREAN SECTION N/A 08/24/2020   Procedure: REPEAT CESAREAN SECTION EDC: 09-13-20 ALLERG: NKDA  PREVIOUS X 1;  Surgeon: Candice Camp, MD;  Location: [facility] LD ORS;  Service: Obstetrics;  Laterality: N/A;   CHOLECYSTECTOMY     IR NEPHRO TUBE REMOV/FL  09/13/2020   IR NEPHROSTOGRAM LEFT THRU EXISTING ACCESS  09/13/2020   IR NEPHROSTOMY EXCHANGE LEFT  08/02/2020   IR NEPHROSTOMY PLACEMENT LEFT  06/23/2020   IR PATIENT EVAL TECH 0-60 MINS  08/16/2020    OB History     Gravida  3   Para  2   Term  2   Preterm      AB  1   Living  2      SAB  1   IAB       Ectopic      Multiple  0   Live Births  2            Home Medications    Prior to Admission medications   Medication Sig Start Date End Date Taking? Authorizing Provider  albuterol (VENTOLIN HFA) 108 (90 Base) MCG/ACT inhaler Inhale 1-2 puffs into the lungs every 6 (six) hours as needed for wheezing or shortness of breath. 05/07/23  Yes Happy Begeman A, FNP  levothyroxine (SYNTHROID, LEVOTHROID) 125 MCG tablet Take 125 mcg by mouth daily before breakfast.    [provider]    Family History Family History  Problem Relation Age of Onset   Diabetes Mother    Heart disease Father    Hypertension Father    Diabetes Father     Social History Social History   Tobacco Use   Smoking status: Never   Smokeless tobacco: Never  Vaping Use   Vaping status: Never Used  Substance Use Topics   Alcohol use: Not Currently   Drug use: Not Currently     Allergies   Patient has no known allergies.   Review of Systems Review of Systems   Physical Exam Triage Vital Signs ED Triage Vitals  Encounter Vitals  Group     BP 05/07/23 0816 115/79     Systolic BP Percentile --      Diastolic BP Percentile --      Pulse Rate 05/07/23 0816 (!) 107     Resp 05/07/23 0816 20     Temp 05/07/23 0816 98.5 F (36.9 C)     Temp Source 05/07/23 0816 Oral     SpO2 05/07/23 0816 95 %     Weight --      Height --      Head Circumference --      Peak Flow --      Pain Score 05/07/23 0818 1     Pain Loc --      Pain Education --      Exclude from Growth Chart --    No data found.  Updated Vital Signs BP 115/79 (BP Location: Right Arm)   Pulse (!) 107   Temp 98.5 F (36.9 C) (Oral)   Resp 20   SpO2 95%   Visual Acuity Right Eye Distance:   Left Eye Distance:   Bilateral Distance:    Right Eye Near:   Left Eye Near:    Bilateral Near:     Physical Exam Vitals and nursing note reviewed.  Constitutional:      General: She is not in acute distress.    Appearance:  Normal appearance. She is not ill-appearing.  Cardiovascular:     Rate and Rhythm: Normal rate and regular rhythm.     Heart sounds: Normal heart sounds.  Pulmonary:     Effort: Pulmonary effort is normal.     Breath sounds: Wheezing and rhonchi present.  Skin:    General: Skin is warm and dry.  Neurological:     Mental Status: She is alert.  Psychiatric:        Mood and Affect: Mood normal.      UC Treatments / Results  Labs (all labs ordered are listed, but only abnormal results are displayed) Labs Reviewed - No data to display  EKG   Radiology No results found.  Procedures Procedures (including critical care time)  Medications Ordered in UC Medications  triamcinolone acetonide (KENALOG-40) injection 40 mg (40 mg Intramuscular Given 05/07/23 0840)    Initial Impression / Assessment and Plan / UC Course  I have reviewed the triage vital signs and the nursing notes.  Pertinent labs & imaging results that were available during my care of the patient were reviewed by me and considered in my medical decision making (see chart for details).     Acute bronchitis-treated with Kenalog shot here in clinic. Sending albuterol inhaler to use as needed for cough, wheezing or shortness of breath.  Recommend Mucinex over-the-counter. Continue allergy medication.  Follow-up as needed Final Clinical Impressions(s) / UC Diagnoses   Final diagnoses:  Acute bronchitis, unspecified organism     Discharge Instructions      Treating you for bronchitis.  Use the inhaler as needed every 6 hours for cough, wheezing or shortness of breath.  Recommend Mucinex over-the-counter. Steroid injection given here in clinic.  Recommend continue your allergy medication.  Follow-up as needed    ED Prescriptions     Medication Sig Dispense Auth. Provider   albuterol (VENTOLIN HFA) 108 (90 Base) MCG/ACT inhaler Inhale 1-2 puffs into the lungs every 6 (six) hours as needed for wheezing or  shortness of breath. 1 each Janace Aris, FNP      PDMP not reviewed  this encounter.   Janace Aris, FNP 05/07/23 [phone]

## 2023-05-07 NOTE — ED Triage Notes (Signed)
 States has been on Amoxicillin then doxycycline for a sinus infection. Stopped taking doxycycline yesterday at the recommendation of ER doctor. Seen at ER on Tuesday for CXR.  Was also tested for covid and ifluenza at that visit and negative. Presents today stating she is now coughing more and wheezing. Had an old inhaler that she has used. States has seasonal allergies and usually gets a kenalog shot every year. Requesting steroid today.

## 2023-05-07 NOTE — Discharge Instructions (Signed)
 Treating you for bronchitis.  Use the inhaler as needed every 6 hours for cough, wheezing or shortness of breath.  Recommend Mucinex over-the-counter. Steroid injection given here in clinic.  Recommend continue your allergy medication.  Follow-up as needed

## 2023-05-14 ENCOUNTER — Ambulatory Visit (HOSPITAL_BASED_OUTPATIENT_CLINIC_OR_DEPARTMENT_OTHER)
Admission: EM | Admit: 2023-05-14 | Discharge: 2023-05-14 | Disposition: A | Discharge status: Home / Self Care | Attending: Emergency Medicine | Admitting: Emergency Medicine

## 2023-05-14 ENCOUNTER — Encounter (HOSPITAL_BASED_OUTPATIENT_CLINIC_OR_DEPARTMENT_OTHER): Payer: Self-pay | Admitting: Emergency Medicine

## 2023-05-14 DIAGNOSIS — R051 Acute cough: Secondary | ICD-10-CM | POA: Diagnosis not present

## 2023-05-14 MED ORDER — PROMETHAZINE-DM 6.25-15 MG/5ML PO SYRP: 5.0000 mL | ORAL_SOLUTION | Freq: Four times a day (QID) | ORAL | 0 refills | Status: AC | PRN

## 2023-05-14 MED ORDER — DEXAMETHASONE SODIUM PHOSPHATE 10 MG/ML IJ SOLN
10.0000 mg | Freq: Once | INTRAMUSCULAR | Status: AC
  Administered 2023-05-14: 10 mg via INTRAMUSCULAR

## 2023-05-14 MED ORDER — BENZONATATE 100 MG PO CAPS: 100.0000 mg | ORAL_CAPSULE | Freq: Three times a day (TID) | ORAL | 0 refills | Status: AC

## 2023-05-14 NOTE — ED Triage Notes (Signed)
 Pt still dealing with barky cough, some nasal congestion since last visit.

## 2023-05-14 NOTE — ED Provider Notes (Signed)
 [facility]    CSN: [id number] Arrival date & time: 05/14/23  1735      History   Chief Complaint Chief Complaint  Patient presents with   Cough    HPI Robin Walker is a 28 y.o. female.   Patient presents to clinic over concerns of ongoing cough.  This has been persistent for the past few weeks.  She did have an emergency department visit where she had negative chest x-ray and negative CT head.  Over the past few weeks she has been started on amoxicillin and stopped this due to provider advice.  When she presented to the ER she was on doxycycline and was advised to stop this due to no pneumonia or sinusitis.  Did complete most of a prednisone taper.  Last time she was at this urgent care she got a steroid injection which helped temporarily.  Has not had any fevers.  Denies any wheezing or shortness of breath.  Cough is nonproductive.  Continues to have nasal congestion.  The history is provided by the patient and medical records.  Cough   Past Medical History:  Diagnosis Date   History of kidney stones    Hypothyroidism    PONV (postoperative nausea and vomiting)     Patient Active Problem List   Diagnosis Date Noted   Previous cesarean section 08/24/2020   Severe sepsis (HCC)    Pyelonephritis affecting pregnancy in third trimester 07/30/2020   Nephrolithiasis 06/22/2020   Cesarean delivery delivered 01/06/2018    Past Surgical History:  Procedure Laterality Date   CESAREAN SECTION N/A 01/06/2018   Procedure: CESAREAN SECTION;  Surgeon: Candice Camp, MD;  Location: [facility];  Service: Obstetrics;  Laterality: N/A;  Primary edc12/8 NKDA need RNFA   CESAREAN SECTION N/A 08/24/2020   Procedure: REPEAT CESAREAN SECTION EDC: 09-13-20 ALLERG: NKDA  PREVIOUS X 1;  Surgeon: Candice Camp, MD;  Location: [facility] ORS;  Service: Obstetrics;  Laterality: N/A;   CHOLECYSTECTOMY     IR NEPHRO TUBE REMOV/FL  09/13/2020   IR NEPHROSTOGRAM LEFT THRU EXISTING  ACCESS  09/13/2020   IR NEPHROSTOMY EXCHANGE LEFT  08/02/2020   IR NEPHROSTOMY PLACEMENT LEFT  06/23/2020   IR PATIENT EVAL TECH 0-60 MINS  08/16/2020    OB History     Gravida  3   Para  2   Term  2   Preterm      AB  1   Living  2      SAB  1   IAB      Ectopic      Multiple  0   Live Births  2            Home Medications    Prior to Admission medications   Medication Sig Start Date End Date Taking? Authorizing Provider  benzonatate (TESSALON) 100 MG capsule Take 1 capsule (100 mg total) by mouth every 8 (eight) hours. 05/14/23  Yes Rinaldo Ratel, [place] N, FNP  promethazine-dextromethorphan (PROMETHAZINE-DM) 6.25-15 MG/5ML syrup Take 5 mLs by mouth 4 (four) times daily as needed for cough. 05/14/23  Yes Rinaldo Ratel, [place], FNP  albuterol (VENTOLIN HFA) 108 (90 Base) MCG/ACT inhaler Inhale 1-2 puffs into the lungs every 6 (six) hours as needed for wheezing or shortness of breath. 05/07/23   Janace Aris, FNP  levothyroxine (SYNTHROID, LEVOTHROID) 125 MCG tablet Take 125 mcg by mouth daily before breakfast.    [provider]    Family History Family History  Problem Relation Age of Onset   Diabetes Mother    Heart disease Father    Hypertension Father    Diabetes Father     Social History Social History   Tobacco Use   Smoking status: Never   Smokeless tobacco: Never  Vaping Use   Vaping status: Never Used  Substance Use Topics   Alcohol use: Not Currently   Drug use: Not Currently     Allergies   Patient has no known allergies.   Review of Systems Review of Systems  Per HPI  Physical Exam Triage Vital Signs ED Triage Vitals  Encounter Vitals Group     BP 05/14/23 1749 114/81     Systolic BP Percentile --      Diastolic BP Percentile --      Pulse Rate 05/14/23 1749 83     Resp 05/14/23 1749 18     Temp 05/14/23 1749 98.4 F (36.9 C)     Temp Source 05/14/23 1749 Oral     SpO2 05/14/23 1749 97 %     Weight --      Height  --      Head Circumference --      Peak Flow --      Pain Score 05/14/23 1748 0     Pain Loc --      Pain Education --      Exclude from Growth Chart --    No data found.  Updated Vital Signs BP 114/81 (BP Location: Right Arm)   Pulse 83   Temp 98.4 F (36.9 C) (Oral)   Resp 18   SpO2 97%   Visual Acuity Right Eye Distance:   Left Eye Distance:   Bilateral Distance:    Right Eye Near:   Left Eye Near:    Bilateral Near:     Physical Exam Vitals and nursing note reviewed.  Constitutional:      Appearance: Normal appearance.  HENT:     Head: Normocephalic and atraumatic.     Right Ear: External ear normal.     Left Ear: External ear normal.     Nose: Congestion and rhinorrhea present.     Mouth/Throat:     Mouth: Mucous membranes are moist.  Eyes:     Conjunctiva/sclera: Conjunctivae normal.  Cardiovascular:     Rate and Rhythm: Normal rate and regular rhythm.     Heart sounds: Normal heart sounds. No murmur heard. Pulmonary:     Effort: Pulmonary effort is normal. No respiratory distress.     Breath sounds: Normal breath sounds.  Neurological:     General: No focal deficit present.     Mental Status: She is alert.  Psychiatric:        Mood and Affect: Mood normal.      UC Treatments / Results  Labs (all labs ordered are listed, but only abnormal results are displayed) Labs Reviewed - No data to display  EKG   Radiology No results found.  Procedures Procedures (including critical care time)  Medications Ordered in UC Medications  dexamethasone (DECADRON) injection 10 mg (has no administration in time range)    Initial Impression / Assessment and Plan / UC Course  I have reviewed the triage vital signs and the nursing notes.  Pertinent labs & imaging results that were available during my care of the patient were reviewed by me and considered in my medical decision making (see chart for details).  Vitals and triage reviewed, patient is  hemodynamically stable.  Lungs are vesicular, heart with regular rate and rhythm.  Congestion and rhinorrhea present on physical exam.  Chest x-ray deferred, recent negative imaging in emergency department.  Treated last week in clinic for bronchitis, encouraged continuation of symptomatic management.  Cough management discussed.  Seems to be postviral cough versus bronchitis, will give another IM steroid injection in clinic as this helped last time.  Plan of care, follow-up care return precautions given, no questions at this time.     Final Clinical Impressions(s) / UC Diagnoses   Final diagnoses:  Acute cough     Discharge Instructions      Unfortunately sometimes a cough can last for up to 6 weeks.  Take Tessalon Perles throughout the day and you can take the promethazine syrup at night.  Do not drink alcohol or drive on the cough syrup as it may cause drowsiness or sedation.  Ensure you are staying well-hydrated, Mucinex can help loosen any secretions and you can take 1200 mg of this daily.  I suggest sleeping with a humidifier as well.  Return to clinic if you develop any new concerning symptoms.  Follow-up with your primary care provider if you do not have any improvement over the next few weeks.    ED Prescriptions     Medication Sig Dispense Auth. Provider   benzonatate (TESSALON) 100 MG capsule Take 1 capsule (100 mg total) by mouth every 8 (eight) hours. 21 capsule Rinaldo Ratel, [place], [place]   promethazine-dextromethorphan (PROMETHAZINE-DM) 6.25-15 MG/5ML syrup Take 5 mLs by mouth 4 (four) times daily as needed for cough. 118 mL Hunner Garcon, [place] N, [place]      PDMP not reviewed this encounter.   Giliana Vantil, [place] N, [place] 05/14/23 [id number]

## 2023-05-14 NOTE — Discharge Instructions (Addendum)
 Unfortunately sometimes a cough can last for up to 6 weeks.  Take Tessalon Perles throughout the day and you can take the promethazine syrup at night.  Do not drink alcohol or drive on the cough syrup as it may cause drowsiness or sedation.  Ensure you are staying well-hydrated, Mucinex can help loosen any secretions and you can take 1200 mg of this daily.  I suggest sleeping with a humidifier as well.  Return to clinic if you develop any new concerning symptoms.  Follow-up with your primary care provider if you do not have any improvement over the next few weeks.

## 2024-01-24 ENCOUNTER — Telehealth: Admitting: Physician Assistant

## 2024-01-24 DIAGNOSIS — B9689 Other specified bacterial agents as the cause of diseases classified elsewhere: Secondary | ICD-10-CM | POA: Diagnosis not present

## 2024-01-24 DIAGNOSIS — J019 Acute sinusitis, unspecified: Secondary | ICD-10-CM

## 2024-01-24 MED ORDER — AMOXICILLIN-POT CLAVULANATE 875-125 MG PO TABS: 1.0000 | ORAL_TABLET | Freq: Two times a day (BID) | ORAL | 0 refills | Status: AC

## 2024-01-24 NOTE — Progress Notes (Signed)
